# Patient Record
Sex: Male | Born: 1935 | Race: White | Hispanic: No | Smoking: Former smoker
Health system: Southern US, Academic
[De-identification: ages and names within clinical notes are randomized; demographics above are authoritative.]

## PROBLEM LIST (undated history)

## (undated) DIAGNOSIS — N1330 Unspecified hydronephrosis: Secondary | ICD-10-CM

## (undated) DIAGNOSIS — H409 Unspecified glaucoma: Secondary | ICD-10-CM

## (undated) DIAGNOSIS — H9190 Unspecified hearing loss, unspecified ear: Secondary | ICD-10-CM

## (undated) DIAGNOSIS — R310 Gross hematuria: Secondary | ICD-10-CM

## (undated) DIAGNOSIS — E782 Mixed hyperlipidemia: Secondary | ICD-10-CM

## (undated) DIAGNOSIS — I493 Ventricular premature depolarization: Secondary | ICD-10-CM

## (undated) DIAGNOSIS — Z8616 Personal history of COVID-19: Secondary | ICD-10-CM

## (undated) DIAGNOSIS — R29898 Other symptoms and signs involving the musculoskeletal system: Secondary | ICD-10-CM

## (undated) DIAGNOSIS — I10 Essential (primary) hypertension: Secondary | ICD-10-CM

## (undated) DIAGNOSIS — I951 Orthostatic hypotension: Secondary | ICD-10-CM

## (undated) DIAGNOSIS — E538 Deficiency of other specified B group vitamins: Secondary | ICD-10-CM

## (undated) DIAGNOSIS — E559 Vitamin D deficiency, unspecified: Secondary | ICD-10-CM

## (undated) DIAGNOSIS — C801 Malignant (primary) neoplasm, unspecified: Secondary | ICD-10-CM

## (undated) DIAGNOSIS — R351 Nocturia: Secondary | ICD-10-CM

## (undated) DIAGNOSIS — Z8719 Personal history of other diseases of the digestive system: Secondary | ICD-10-CM

## (undated) DIAGNOSIS — M4802 Spinal stenosis, cervical region: Secondary | ICD-10-CM

## (undated) DIAGNOSIS — G20A1 Parkinson's disease without dyskinesia, without mention of fluctuations: Secondary | ICD-10-CM

## (undated) DIAGNOSIS — G20 Parkinson's disease: Secondary | ICD-10-CM

## (undated) DIAGNOSIS — H60543 Acute eczematoid otitis externa, bilateral: Secondary | ICD-10-CM

## (undated) DIAGNOSIS — F319 Bipolar disorder, unspecified: Secondary | ICD-10-CM

## (undated) DIAGNOSIS — M439 Deforming dorsopathy, unspecified: Secondary | ICD-10-CM

## (undated) DIAGNOSIS — M75102 Unspecified rotator cuff tear or rupture of left shoulder, not specified as traumatic: Secondary | ICD-10-CM

## (undated) DIAGNOSIS — H6123 Impacted cerumen, bilateral: Secondary | ICD-10-CM

## (undated) DIAGNOSIS — G4730 Sleep apnea, unspecified: Secondary | ICD-10-CM

## (undated) DIAGNOSIS — N401 Enlarged prostate with lower urinary tract symptoms: Secondary | ICD-10-CM

## (undated) HISTORY — PX: LAPAROSCOPIC CHOLECYSTECTOMY: SUR755

## (undated) HISTORY — DX: Acute eczematoid otitis externa, bilateral: H60.543

## (undated) HISTORY — DX: Malignant (primary) neoplasm, unspecified: C80.1

## (undated) HISTORY — DX: Deficiency of other specified B group vitamins: E53.8

## (undated) HISTORY — DX: Other symptoms and signs involving the musculoskeletal system: R29.898

## (undated) HISTORY — PX: HX TONSILLECTOMY: SHX27

## (undated) HISTORY — PX: HX GALL BLADDER SURGERY/CHOLE: SHX55

## (undated) HISTORY — DX: Gross hematuria: R31.0

## (undated) HISTORY — DX: Unspecified glaucoma: H40.9

## (undated) HISTORY — DX: Parkinson's disease without dyskinesia, without mention of fluctuations: G20.A1

## (undated) HISTORY — PX: HX CATARACT REMOVAL: SHX102

## (undated) HISTORY — DX: Sleep apnea, unspecified: G47.30

## (undated) HISTORY — DX: Spinal stenosis, cervical region: M48.02

## (undated) HISTORY — PX: HX HIP REPLACEMENT: SHX124

## (undated) HISTORY — DX: Personal history of other diseases of the digestive system: Z87.19

## (undated) HISTORY — DX: Parkinson's disease (CMS HCC): G20

## (undated) HISTORY — DX: Unspecified hearing loss, unspecified ear: H91.90

## (undated) HISTORY — DX: Vitamin D deficiency, unspecified: E55.9

## (undated) HISTORY — DX: Nocturia: R35.1

## (undated) HISTORY — DX: Deforming dorsopathy, unspecified: M43.9

## (undated) HISTORY — DX: Personal history of COVID-19: Z86.16

## (undated) HISTORY — DX: Benign prostatic hyperplasia with lower urinary tract symptoms: N40.1

## (undated) HISTORY — DX: Unspecified hydronephrosis: N13.30

## (undated) HISTORY — DX: Bipolar disorder, unspecified: F31.9

## (undated) HISTORY — DX: Ventricular premature depolarization: I49.3

## (undated) HISTORY — DX: Unspecified rotator cuff tear or rupture of left shoulder, not specified as traumatic: M75.102

## (undated) HISTORY — PX: HX BACK SURGERY: SHX140

## (undated) HISTORY — DX: Essential (primary) hypertension: I10

## (undated) HISTORY — DX: Impacted cerumen, bilateral: H61.23

## (undated) HISTORY — DX: Orthostatic hypotension: I95.1

## (undated) HISTORY — DX: Mixed hyperlipidemia: E78.2

---

## 1999-05-17 ENCOUNTER — Inpatient Hospital Stay (HOSPITAL_COMMUNITY): Payer: Self-pay

## 2011-05-07 DIAGNOSIS — M1610 Unilateral primary osteoarthritis, unspecified hip: Secondary | ICD-10-CM | POA: Insufficient documentation

## 2011-05-07 DIAGNOSIS — Z96642 Presence of left artificial hip joint: Secondary | ICD-10-CM | POA: Insufficient documentation

## 2013-10-27 DIAGNOSIS — H409 Unspecified glaucoma: Secondary | ICD-10-CM | POA: Insufficient documentation

## 2013-10-27 DIAGNOSIS — M419 Scoliosis, unspecified: Secondary | ICD-10-CM | POA: Insufficient documentation

## 2013-10-30 DIAGNOSIS — R000 Tachycardia, unspecified: Secondary | ICD-10-CM | POA: Insufficient documentation

## 2013-10-30 DIAGNOSIS — D62 Acute posthemorrhagic anemia: Secondary | ICD-10-CM | POA: Insufficient documentation

## 2013-11-29 DIAGNOSIS — Z981 Arthrodesis status: Secondary | ICD-10-CM | POA: Insufficient documentation

## 2014-03-28 DIAGNOSIS — Z9989 Dependence on other enabling machines and devices: Secondary | ICD-10-CM | POA: Insufficient documentation

## 2014-04-03 DIAGNOSIS — M40203 Unspecified kyphosis, cervicothoracic region: Secondary | ICD-10-CM | POA: Insufficient documentation

## 2018-11-22 DIAGNOSIS — F331 Major depressive disorder, recurrent, moderate: Secondary | ICD-10-CM | POA: Insufficient documentation

## 2020-08-17 IMAGING — MR MRI BRAIN WITHOUT CONTRAST
8 of 9 series · 36 of 48 positions shown · non-contrast
Comparison: None previous.

﻿EXAM:  MRI BRAIN WITHOUT CONTRAST
INDICATION: 85-year-old with left-sided weakness.  No prior history of malignancy.  History of back surgery.
TECHNIQUE: Axial, coronal and sagittal images including diffusion-weighted sequence, T2* gradient echo sequence, FLAIR sequence and T1 and T2 sequences.

[Series 5: DWI · axial · 5.0mm · 1.35mm/px · z∈[-23,+97]mm · 10 of 88 slices shown (1 of 3)]
[im 6/88]
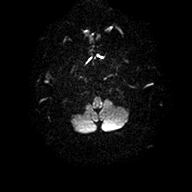
[im 12/88]
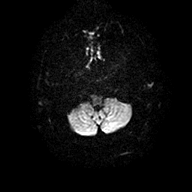
[im 18/88]
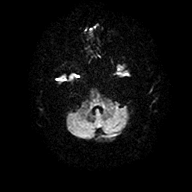
[im 30/88]
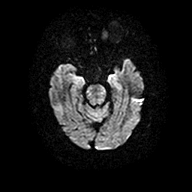
[im 41/88]
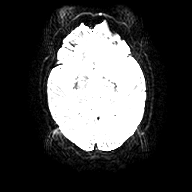
[im 47/88]
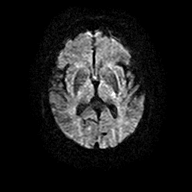
[im 53/88]
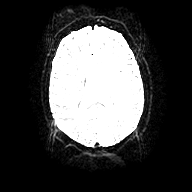
[im 64/88]
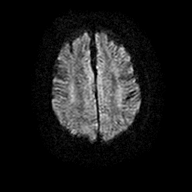
[im 76/88]
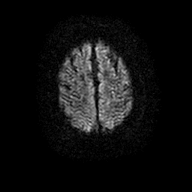
[im 88/88]
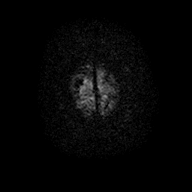

[Series 6: DWI · axial · 5.0mm · 1.35mm/px · z∈[-29,+97]mm · 4 of 22 slices shown (2 of 3)]
[im 1/22]
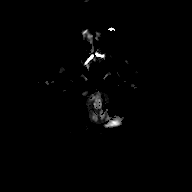
[im 8/22]
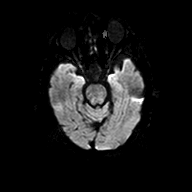
[im 15/22]
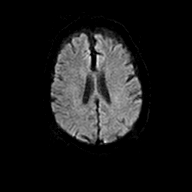
[im 22/22]
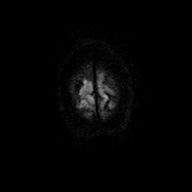

[Series 7: DWI · axial · 5.0mm · 1.35mm/px · z∈[-29,+97]mm · 4 of 22 slices shown (3 of 3)]
[im 1/22]
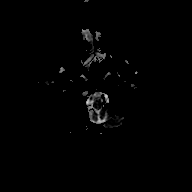
[im 8/22]
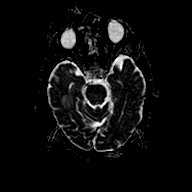
[im 15/22]
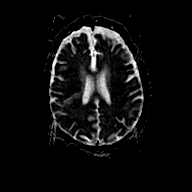
[im 22/22]
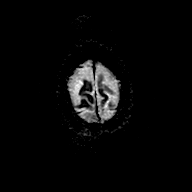

[Series 8: FLAIR · sagittal · 4.0mm · 0.75mm/px · 4 of 26 slices shown (1 of 2)]
[im 1/26]
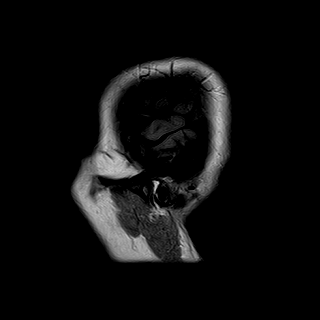
[im 9/26]
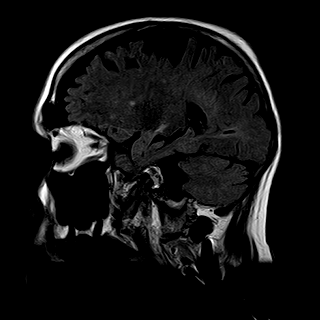
[im 17/26]
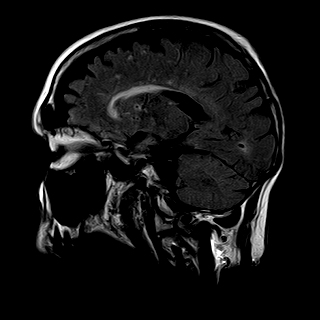
[im 26/26]
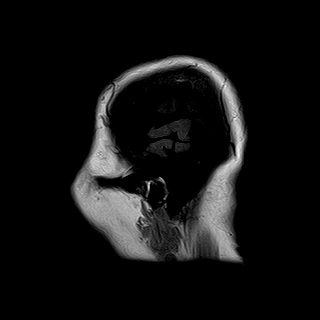

[Series 9: T2 · axial · 5.0mm · 0.43mm/px · z∈[-39,+105]mm · 4 of 25 slices shown (1 of 2)]
[im 1/25]
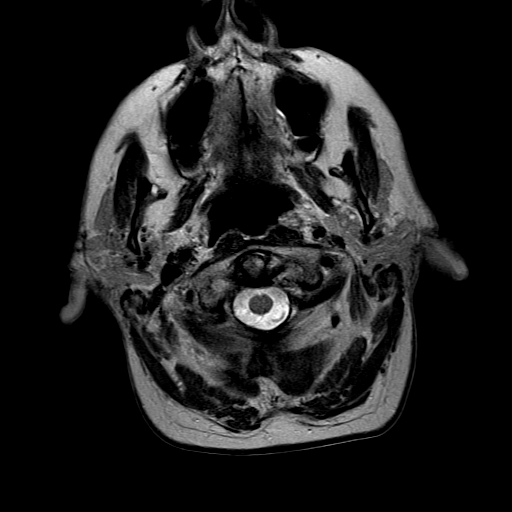
[im 9/25]
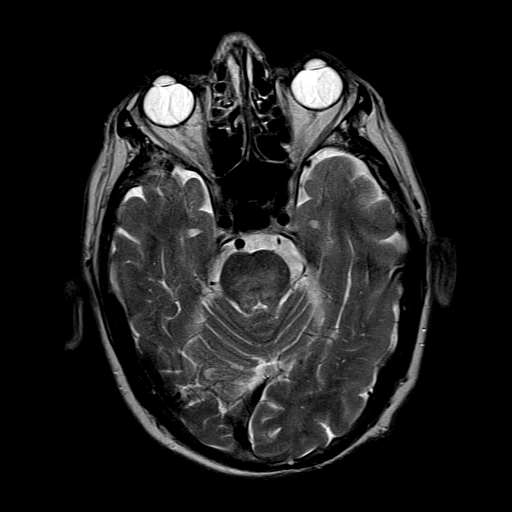
[im 17/25]
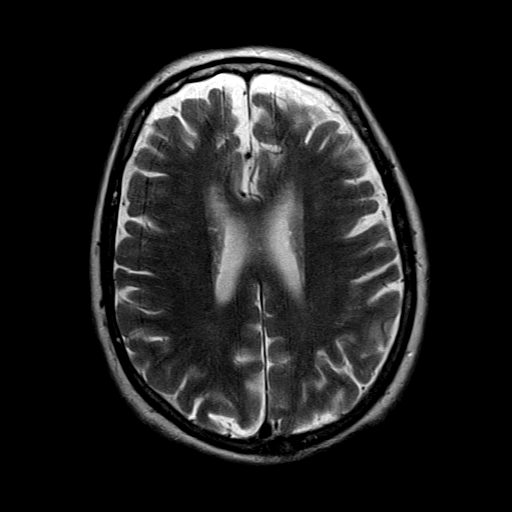
[im 25/25]
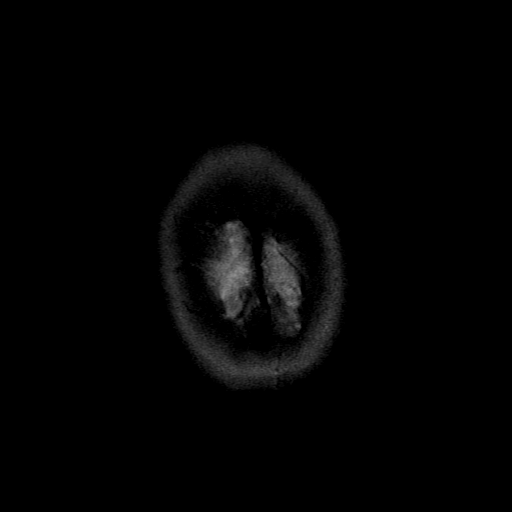

[Series 10: FLAIR · axial · 5.0mm · 0.43mm/px · z∈[-39,+105]mm · 4 of 25 slices shown (2 of 2)]
[im 1/25]
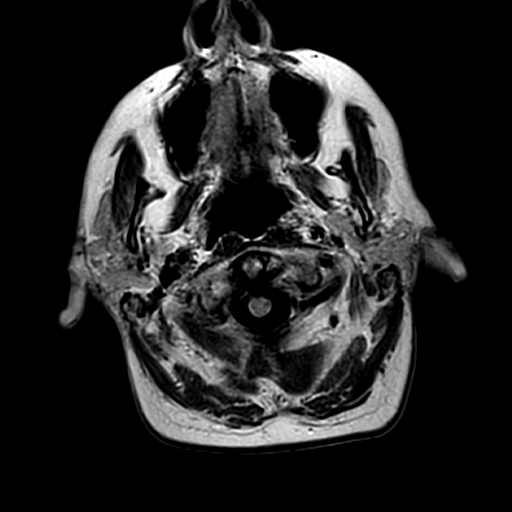
[im 9/25]
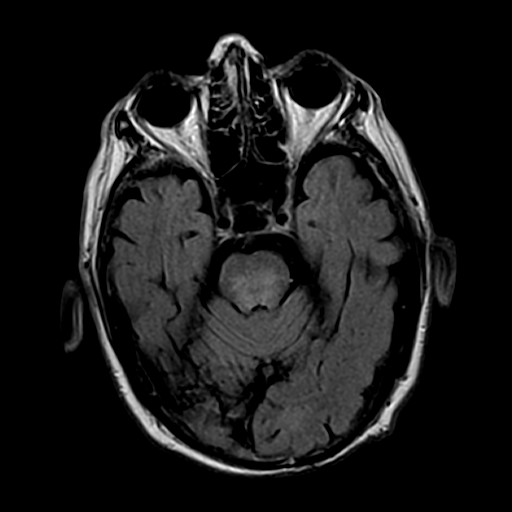
[im 17/25]
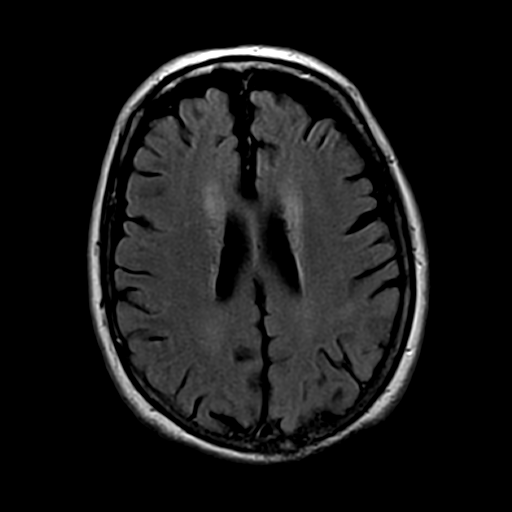
[im 25/25]
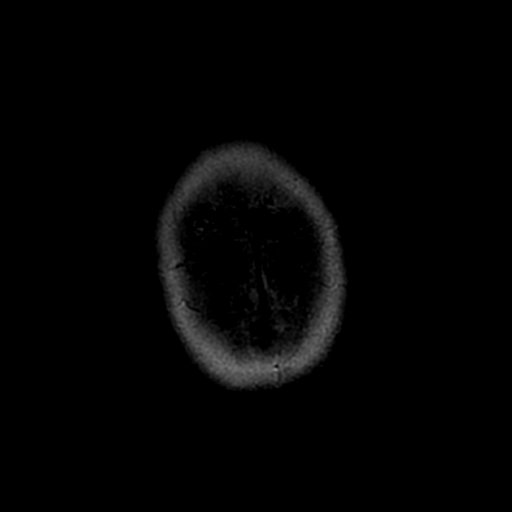

[Series 11: T1 · axial · 5.0mm · 0.43mm/px · z∈[-39,+9]mm · 2 of 25 slices shown]
[im 1/25]
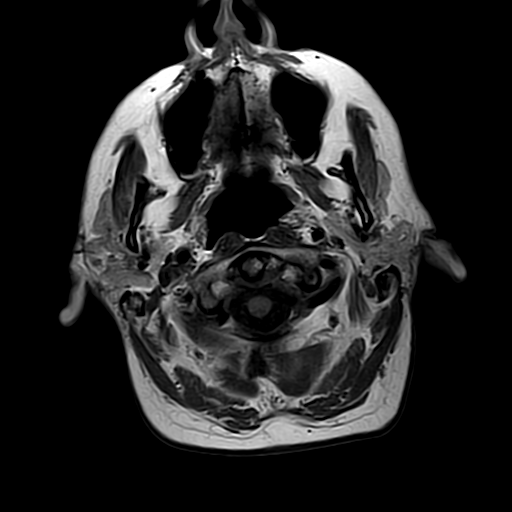
[im 9/25]
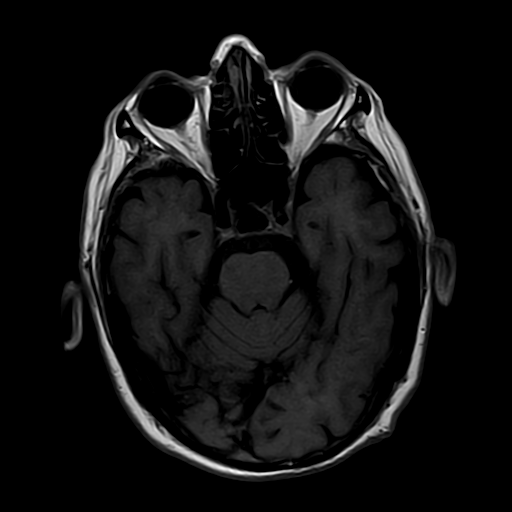

[Series 13: T2 · coronal · 6.0mm · 0.43mm/px · 4 of 24 slices shown (2 of 2)]
[im 1/24]
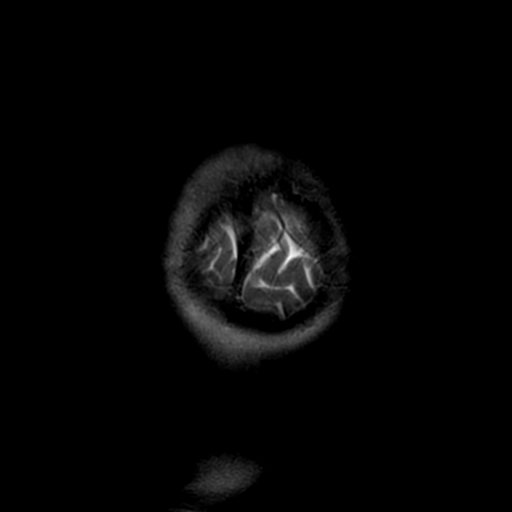
[im 8/24]
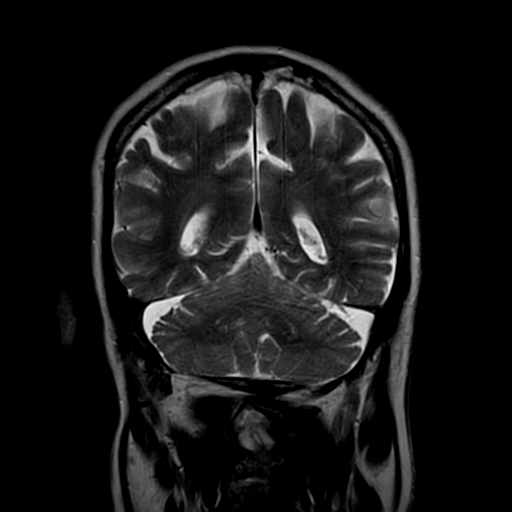
[im 16/24]
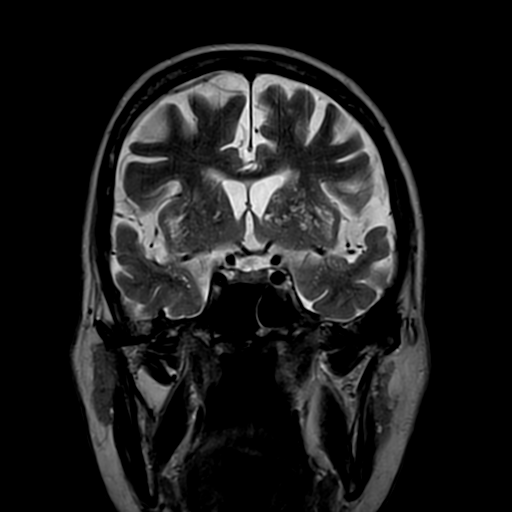
[im 24/24]
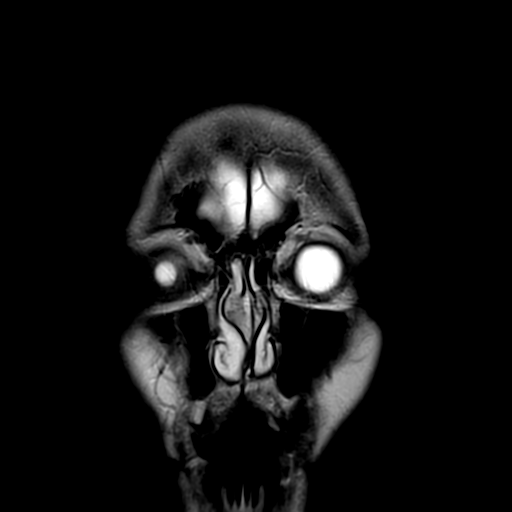

[36 of 48 positions shown; findings below may reference images not displayed]

FINDINGS: No acute or subacute ischemic changes on diffusion sequence. 

Mild chronic small vessel ischemic change of deep white matter.  Moderately significant symmetric global cerebral cortical atrophy.  Moderate symmetric cerebellar atrophy.  No ventriculomegaly or midline shift.  

Major arteries of circle of Willis and dural venous sinuses are patent.
IMPRESSION: 1. No evidence of acute or subacute ischemia or demyelinating process. 

2. No ventriculomegaly or space occupying lesions.

3. Moderately significant global symmetric cerebral atrophy and cerebellar atrophy.  

4. Arteries of circle of Willis are patent.

## 2020-08-28 IMAGING — MR MRI LUMBAR SPINE WITHOUT CONTRAST
6 series · 44 of 48 positions shown · IV contrast (gadolinium)
Comparison: None available.

﻿EXAM:  MRI LUMBAR SPINE WITHOUT CONTRAST
INDICATION: Chronic lower back pain.
TECHNIQUE: Multiplanar multisequential MRI of the lumbar spine was performed without gadolinium contrast.

[Series 10: T2 · sagittal · 4.2mm · 0.94mm/px · 7 of 15 slices shown (1 of 3)]
[im 1/15]
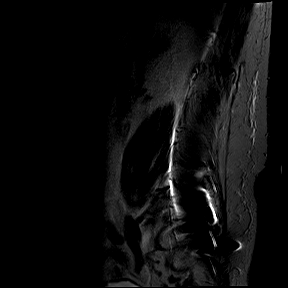
[im 3/15]
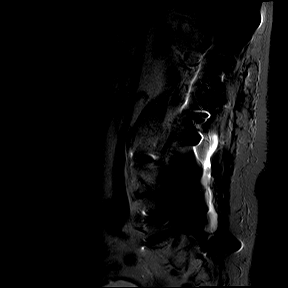
[im 5/15]
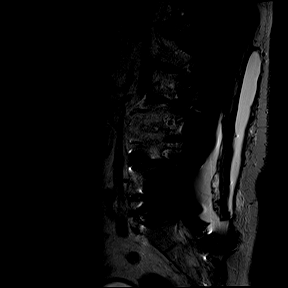
[im 8/15]
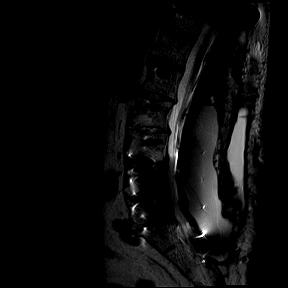
[im 10/15]
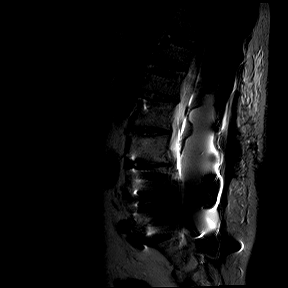
[im 12/15]
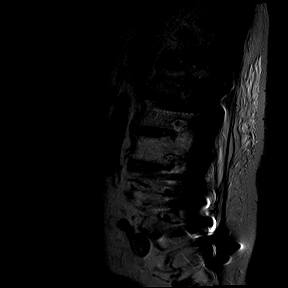
[im 15/15]
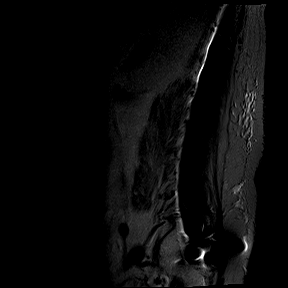

[Series 11: T1 · sagittal · 4.2mm · 0.94mm/px · 6 of 15 slices shown (1 of 2)]
[im 1/15]
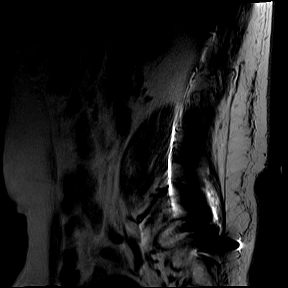
[im 3/15]
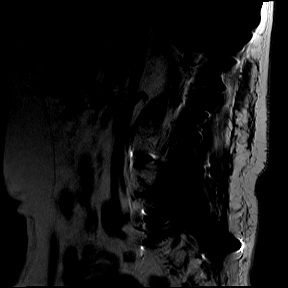
[im 6/15]
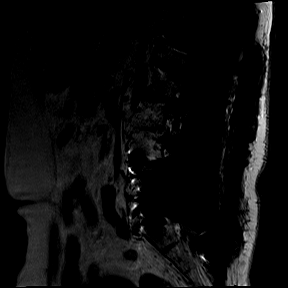
[im 9/15]
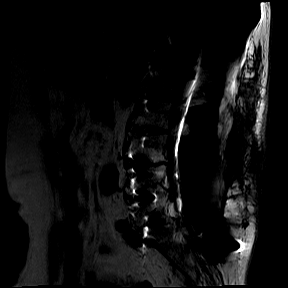
[im 12/15]
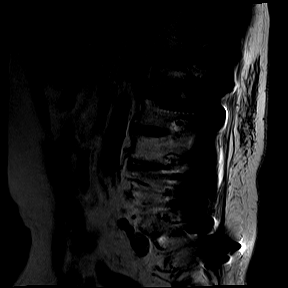
[im 15/15]
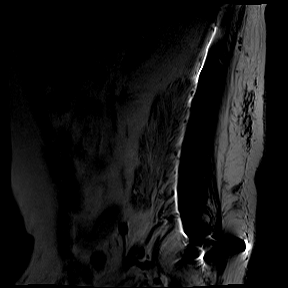

[Series 12: STIR · sagittal · 4.2mm · 1.05mm/px · 6 of 15 slices shown]
[im 1/15]
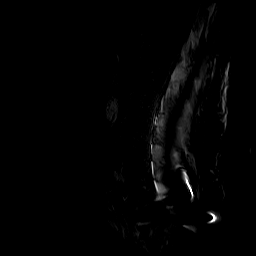
[im 3/15]
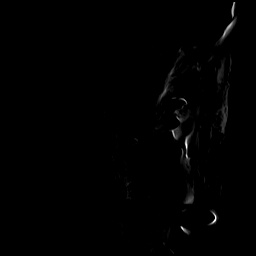
[im 6/15]
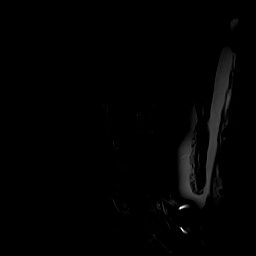
[im 9/15]
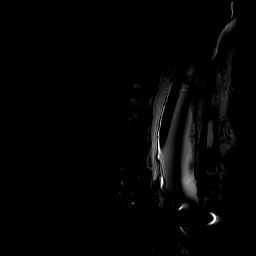
[im 12/15]
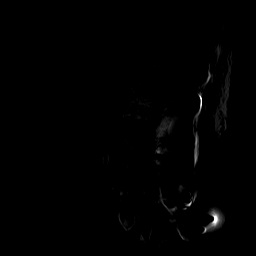
[im 15/15]
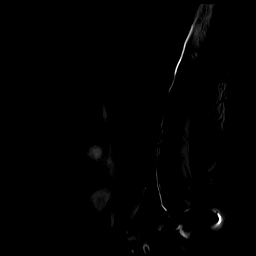

[Series 14: T2 · axial · 4.0mm · 0.47mm/px · z∈[-98,+70]mm · 8 of 23 slices shown (2 of 3)]
[im 1/23]
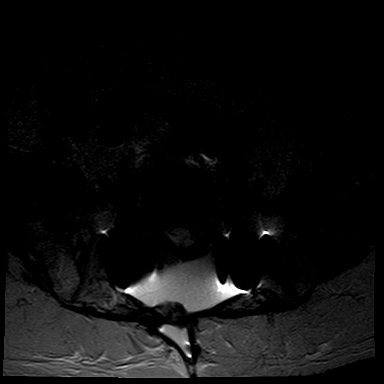
[im 3/23]
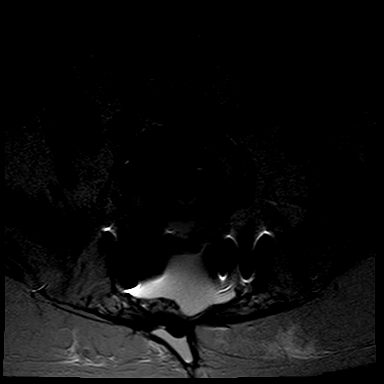
[im 8/23]
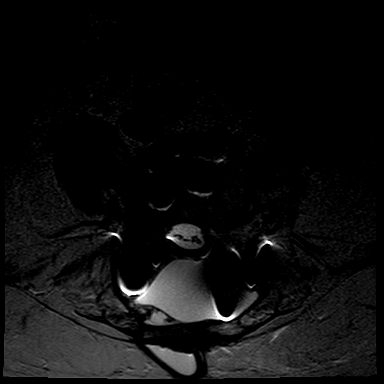
[im 10/23]
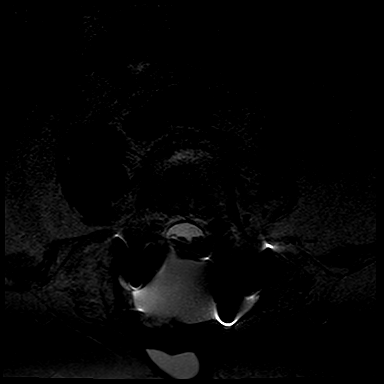
[im 13/23]
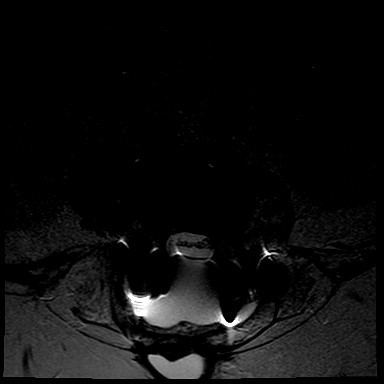
[im 15/23]
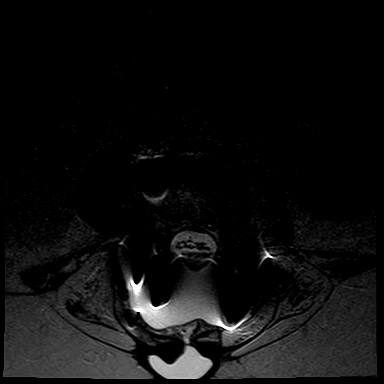
[im 20/23]
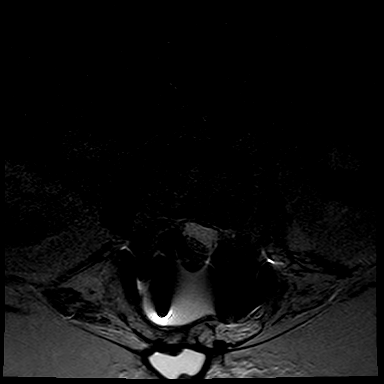
[im 23/23]
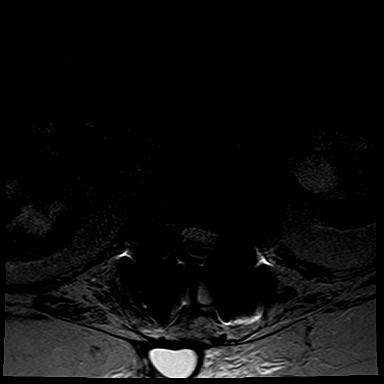

[Series 15: T1 · axial · 4.0mm · 0.47mm/px · z∈[-98,+70]mm · 8 of 23 slices shown (2 of 2)]
[im 1/23]
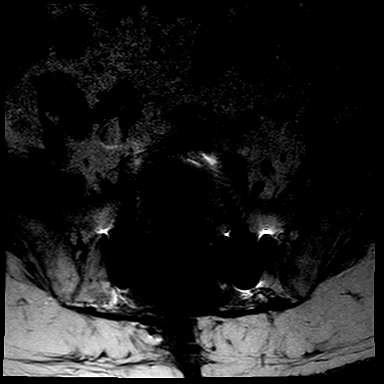
[im 3/23]
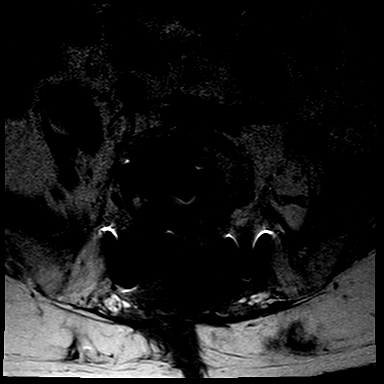
[im 8/23]
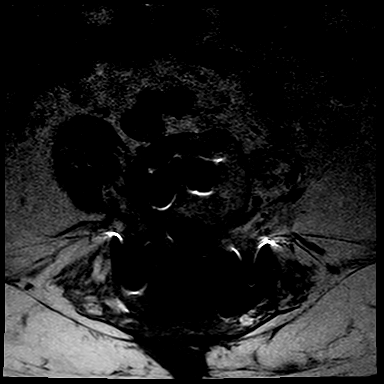
[im 10/23]
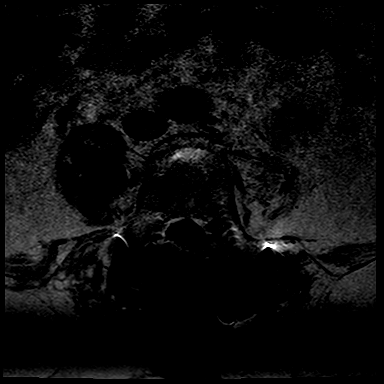
[im 13/23]
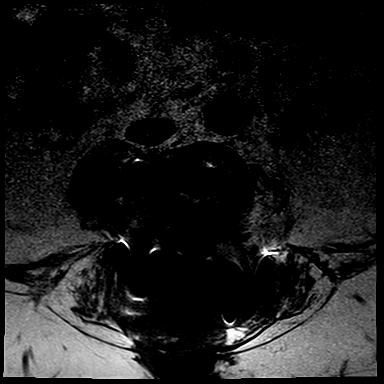
[im 15/23]
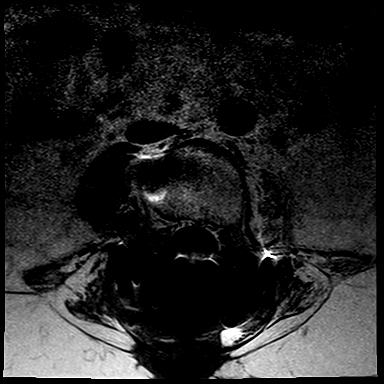
[im 20/23]
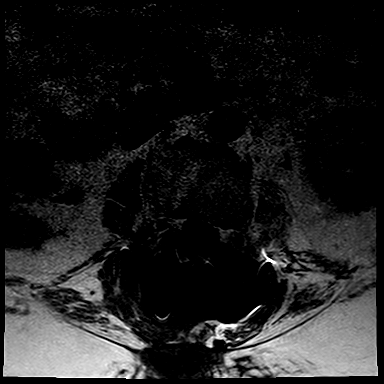
[im 23/23]
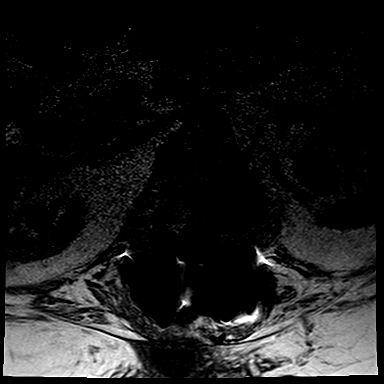

[Series 16: T2 · coronal · 4.0mm · 0.90mm/px · 9 of 20 slices shown (3 of 3)]
[im 1/20]
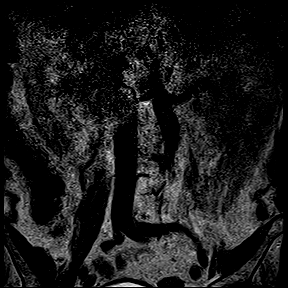
[im 3/20]
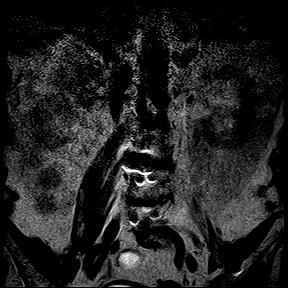
[im 5/20]
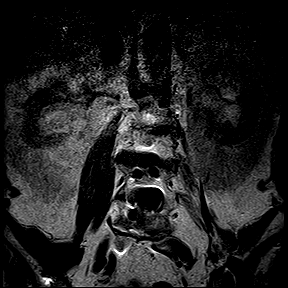
[im 8/20]
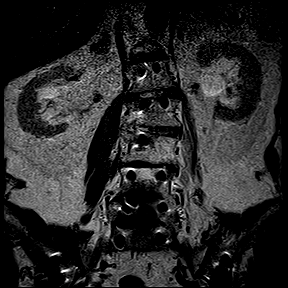
[im 10/20]
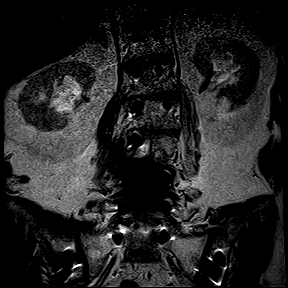
[im 12/20]
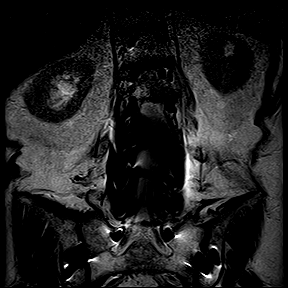
[im 15/20]
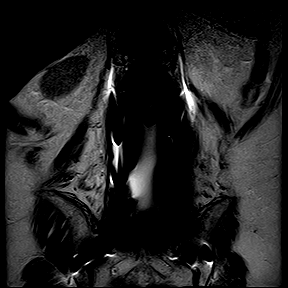
[im 17/20]
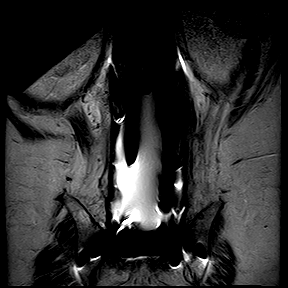
[im 20/20]
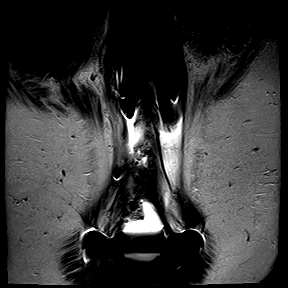

[44 of 48 positions shown; findings below may reference images not displayed]

FINDINGS: Bone marrow signal intensity is normal. There is no acute fracture or subluxation. Extensive posterior surgical fusion of the visualized thoracolumbar spine is noted with bipedicular screws and rods, extending up to S1 vertebral body level. Interbody fusion devices are also noted at L3-4, L4-5 and L5-S1 levels. L2 through L5 laminectomy defects are also identified. Fluid within the laminectomy beds is noted at all levels, likely postsurgical. Distal spinal cord is normal in signal intensity and terminates normally at T12-L1 disc space level. There is clumping of nerve roots within the dependent thecal sac suggestive of arachnoiditis.  

At L1-2 level, there is a small broad-based central disc bulge, mildly effacing the ventral thecal sac. There is no significant neural foraminal stenosis.

At L2-3 level, there is a minimal bulging annulus, minimally effacing the ventral thecal sac. There is no significant neural foraminal stenosis.

L3-4, L4-5 and L5-S1 levels appear unremarkable.

Parapelvic renal cysts are noted bilaterally.
IMPRESSION: 1. Extensive postsurgical changes of the visualized thoracolumbar spine. 

2. Clumping of nerve roots within the dependent thecal sac suggestive of arachnoiditis. 

3. No significant disc herniation, spinal canal or neural foraminal stenosis at any level.

## 2020-08-28 IMAGING — MR MRI CERVICAL SPINE WITHOUT CONTRAST
5 of 6 series · 33 of 48 positions shown · IV contrast (gadolinium)
Comparison: None available.

﻿EXAM:  MRI CERVICAL SPINE WITHOUT CONTRAST
INDICATION: Neck pain.
TECHNIQUE: Multiplanar multisequential MRI of the cervical spine was performed without gadolinium contrast.

[Series 5: T2 · sagittal · 3.0mm · 0.78mm/px · 6 of 13 slices shown (1 of 3)]
[im 1/13]
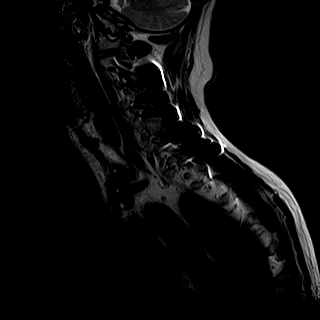
[im 3/13]
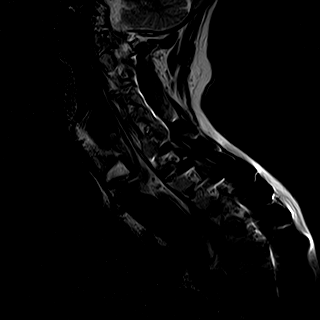
[im 5/13]
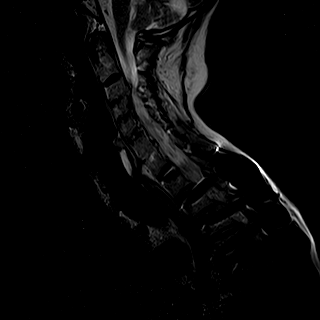
[im 8/13]
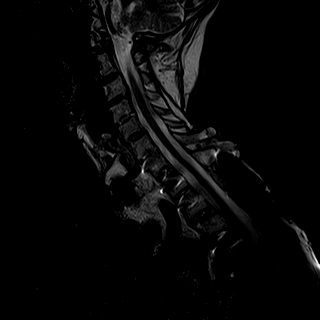
[im 10/13]
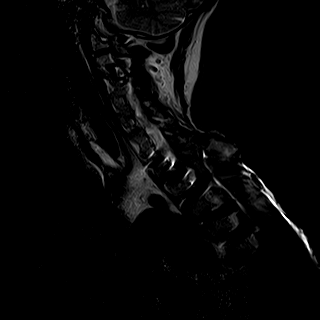
[im 13/13]
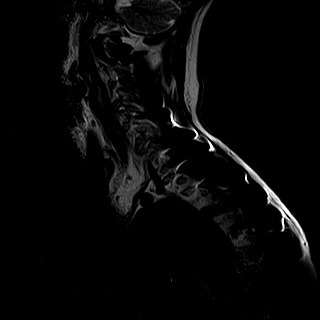

[Series 6: T1 · sagittal · 3.0mm · 0.78mm/px · 6 of 13 slices shown]
[im 1/13]
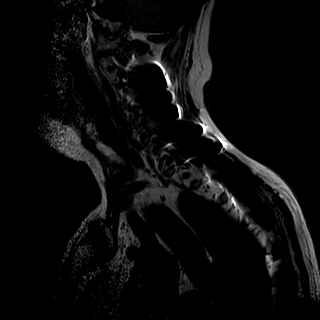
[im 3/13]
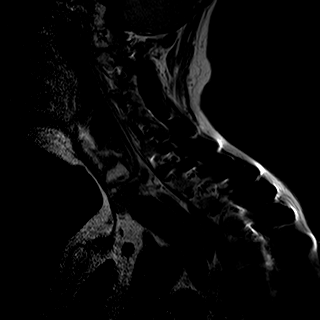
[im 5/13]
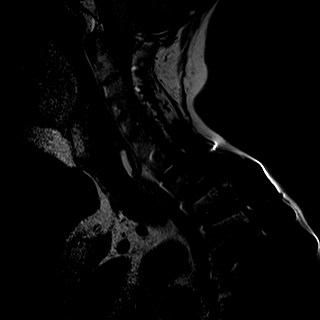
[im 8/13]
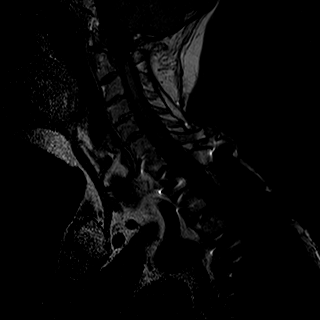
[im 10/13]
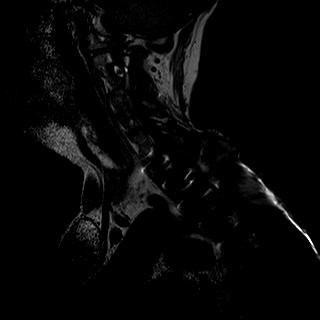
[im 13/13]
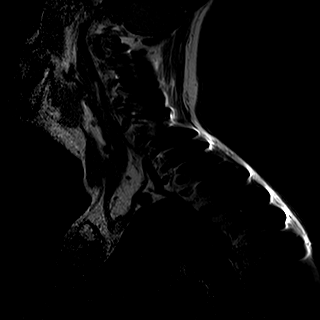

[Series 7: STIR · sagittal · 3.0mm · 0.87mm/px · 3 of 13 slices shown]
[im 1/13]
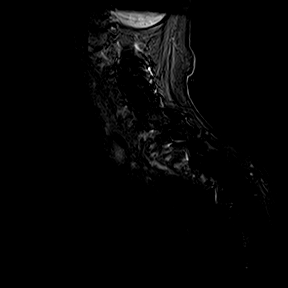
[im 3/13]
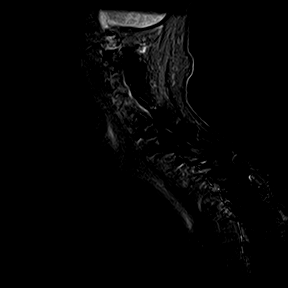
[im 5/13]
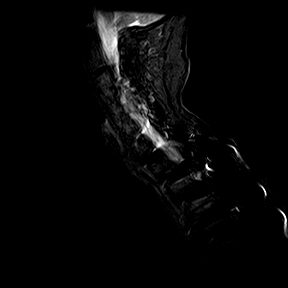

[Series 8: T2 · axial · 4.0mm · 0.56mm/px · z∈[-69,+17]mm · 9 of 26 slices shown (2 of 3)]
[im 1/26]
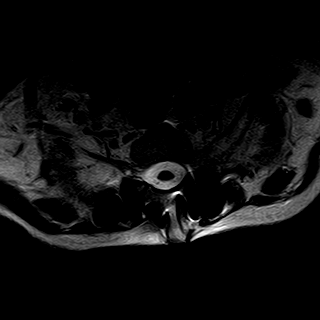
[im 5/26]
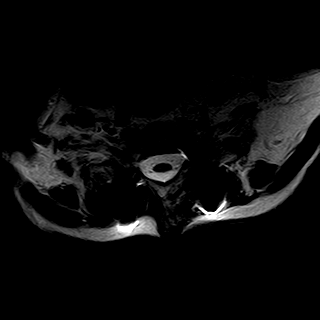
[im 7/26]
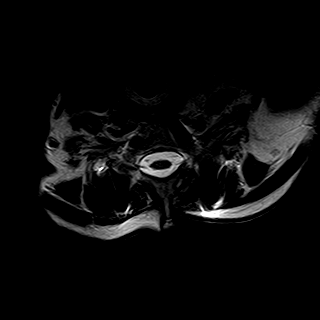
[im 12/26]
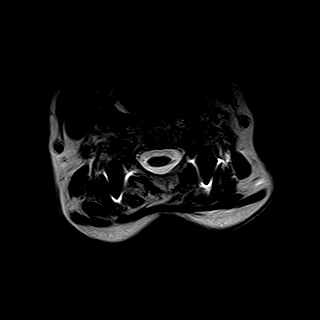
[im 14/26]
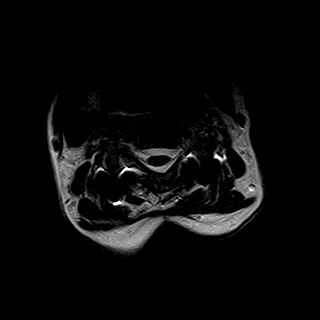
[im 19/26]
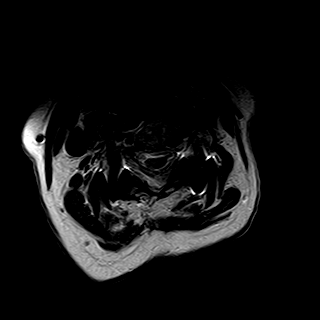
[im 21/26]
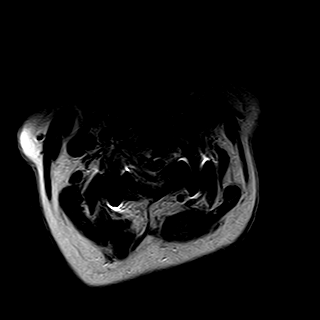
[im 23/26]
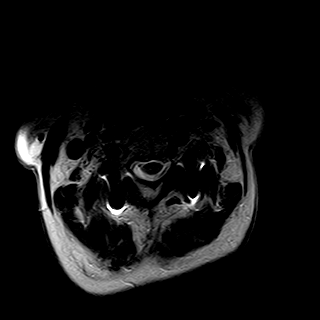
[im 26/26]
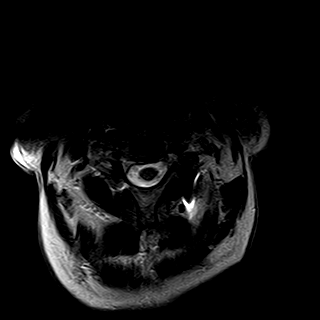

[Series 11: T2 · oblique · 4.0mm · 0.31mm/px · 9 of 18 slices shown (3 of 3)]
[im 1/18]
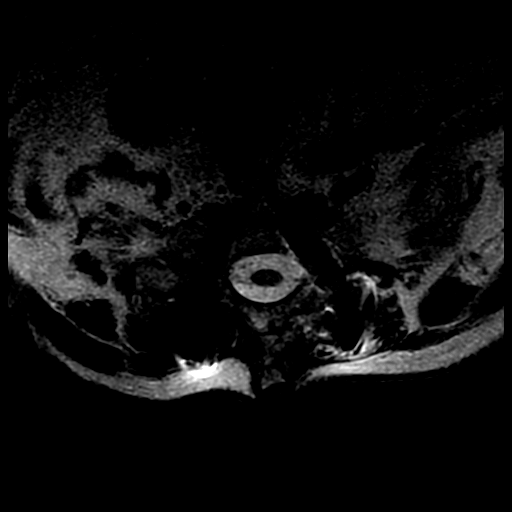
[im 3/18]
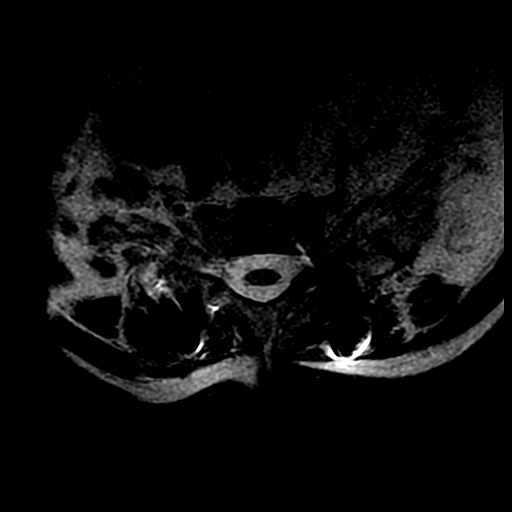
[im 5/18]
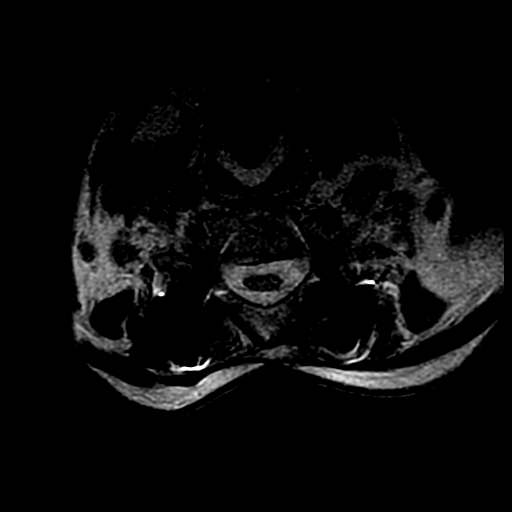
[im 7/18]
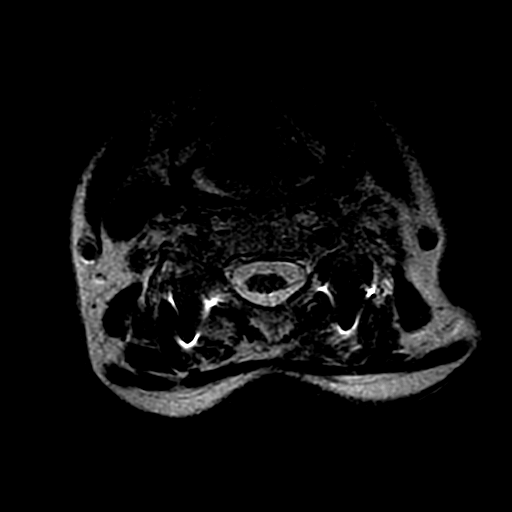
[im 9/18]
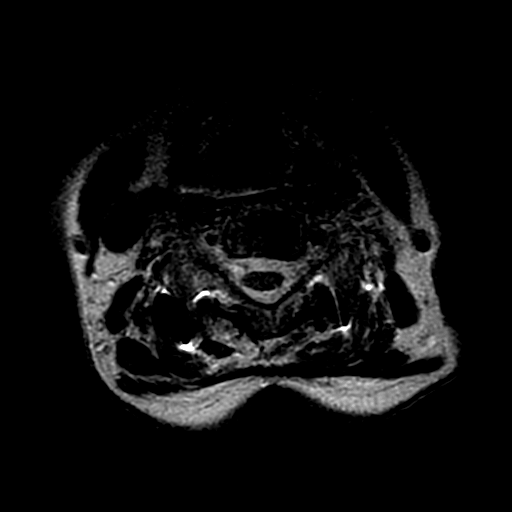
[im 11/18]
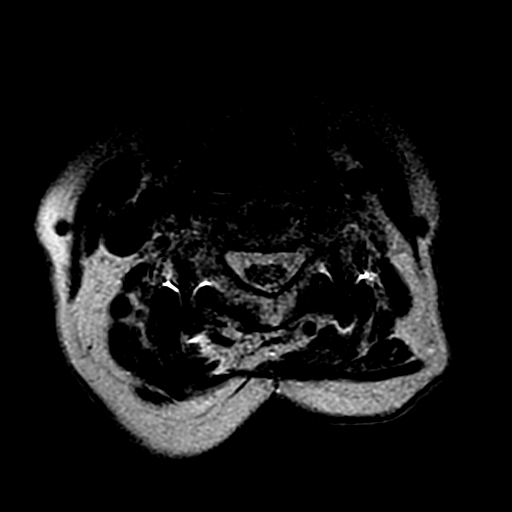
[im 13/18]
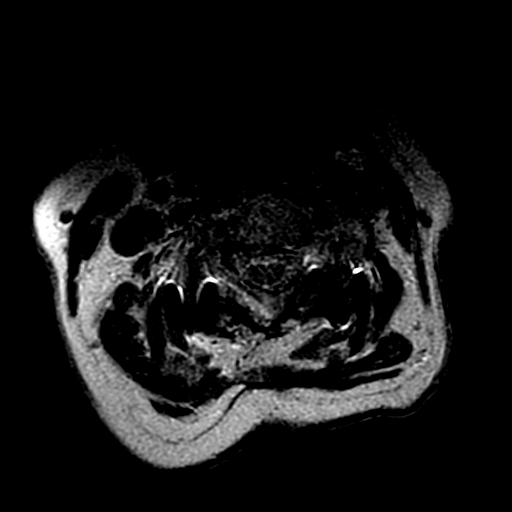
[im 15/18]
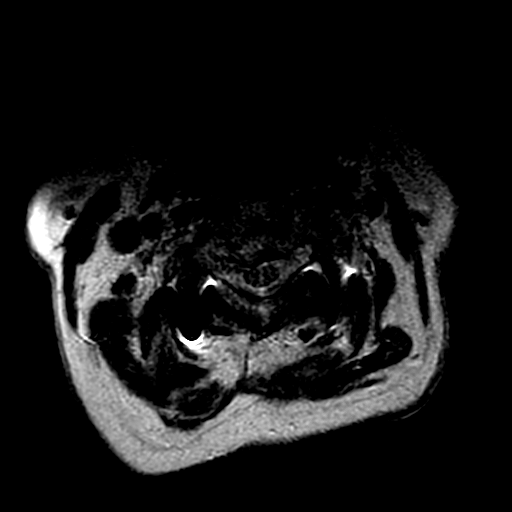
[im 18/18]
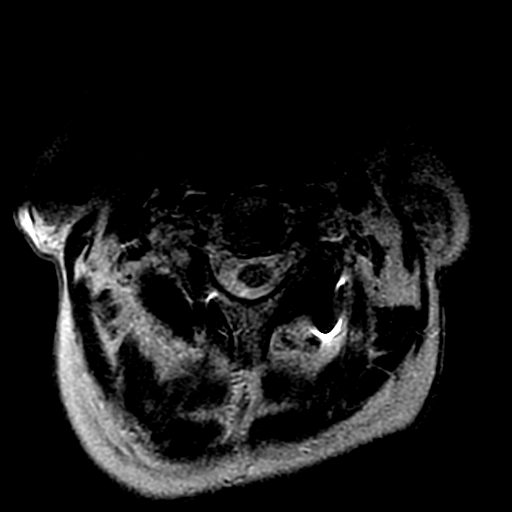

[33 of 48 positions shown; findings below may reference images not displayed]

FINDINGS: Bone marrow signal intensity is normal. There is no acute fracture or subluxation. Extensive surgical fusion of the visualized thoracic spine is seen starting from C7 vertebral body. Visualized cervical spinal cord is normal in signal intensity without evidence of compression at any level.

C2-3 level is unremarkable.

At C3-4 level, there is moderate disc desiccation. There is a small broad-based central disc bulge partially effacing the ventral CSF. There is no significant neural foraminal stenosis.

C4-5 level is unremarkable.

At C5-6 level, there is minimal anterolisthesis of C5 on C6 vertebral body. There is a minimal bulging annulus, minimally effacing the ventral CSF. There is no significant neural foraminal stenosis.

At C6-7 level, there is a small broad-based central disc bulge, mildly effacing the ventral CSF. There is no significant neural foraminal stenosis.

C7-T1 level and paraspinal soft tissues are unremarkable.
IMPRESSION: 1. Incompletely assessed extensive postsurgical changes of the thoracic spine from C7 vertebral body level. 

2. Minimal anterolisthesis of C5 on C6 vertebral body. 

3. No significant disc herniation, spinal canal or neural foraminal stenosis at any level within the cervical spine.

## 2020-09-13 IMAGING — MR MRI JOINT UPPER EXTREMITY WITHOUT CONTRAST LT
6 series · 40 of 40 positions shown · IV contrast (gadolinium)
Comparison: None available.

﻿EXAM:  MRI JOINT UPPER EXTREMITY WITHOUT CONTRAST LT
INDICATION: Pain, history of fall.
TECHNIQUE: Multiplanar multisequential MRI of the left shoulder joint was performed without gadolinium contrast.

[Series 8: T1 · oblique · left · 3.5mm · 0.33mm/px · 7 of 21 slices shown]
[im 1/21]
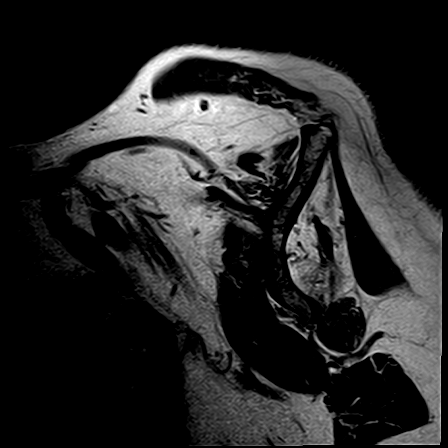
[im 4/21]
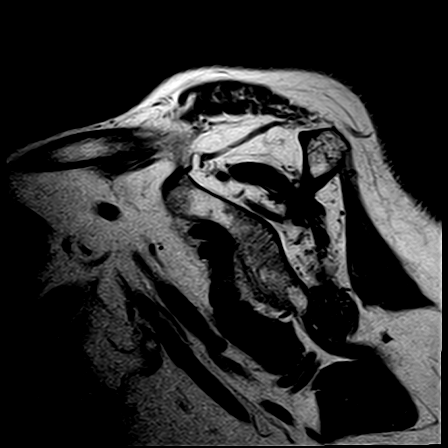
[im 7/21]
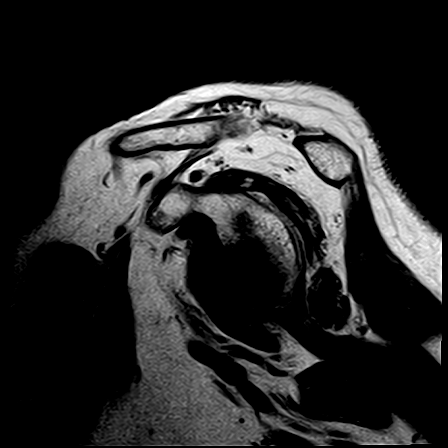
[im 11/21]
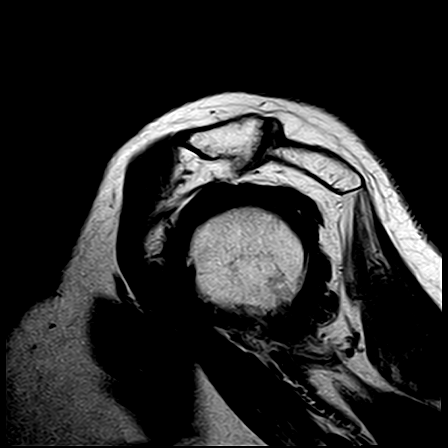
[im 14/21]
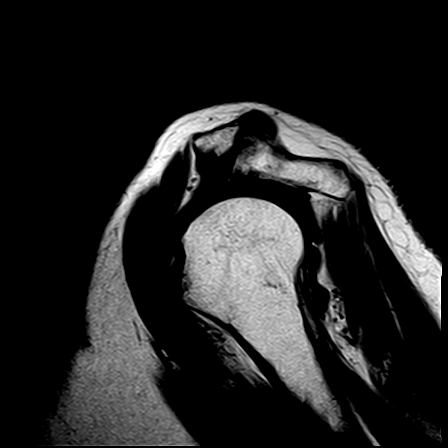
[im 17/21]
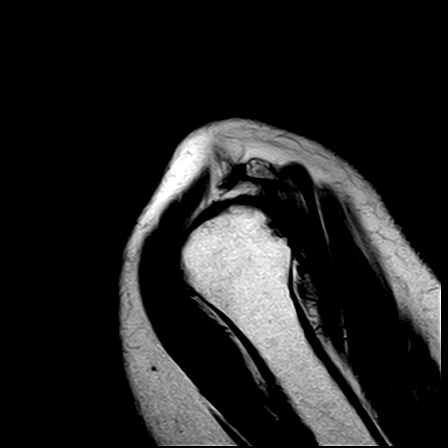
[im 21/21]
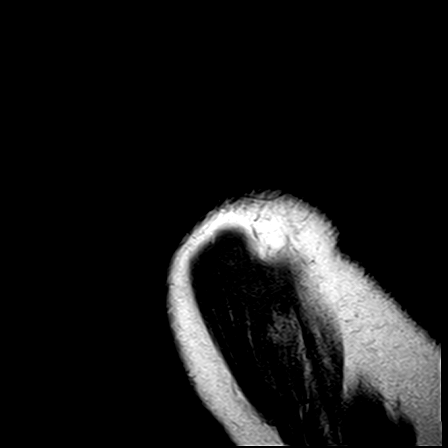

[Series 9: PD fat-sat · axial · left · 4.0mm · 0.50mm/px · z∈[-85,+2]mm · 7 of 21 slices shown (1 of 3)]
[im 1/21]
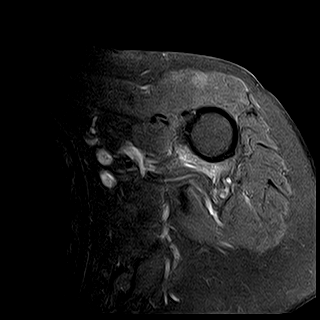
[im 4/21]
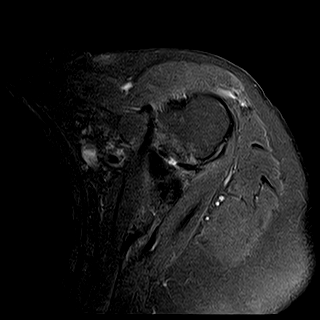
[im 7/21]
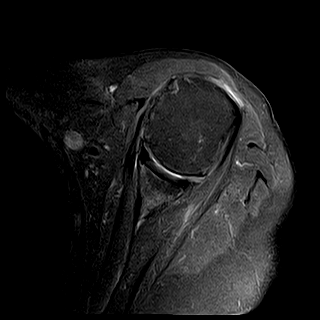
[im 11/21]
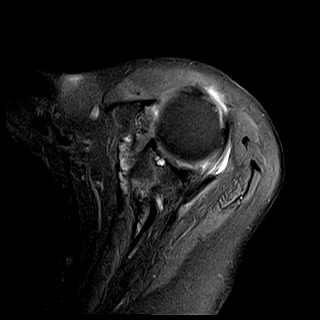
[im 14/21]
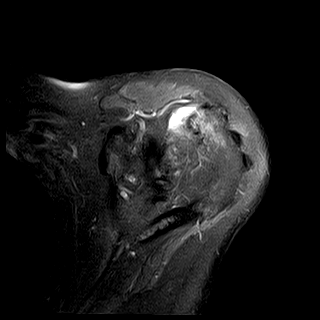
[im 17/21]
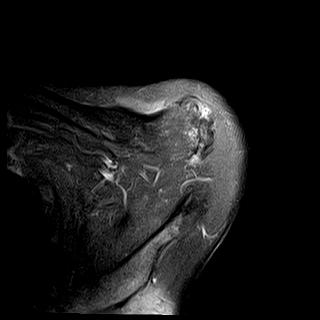
[im 21/21]
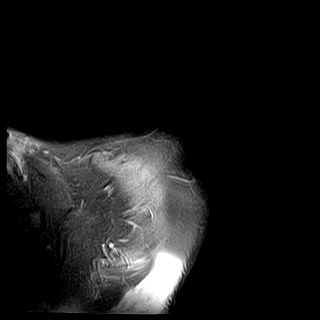

[Series 10: PD fat-sat · axial · left · 4.0mm · 0.50mm/px · z∈[-85,+2]mm · 6 of 21 slices shown (2 of 3)]
[im 1/21]
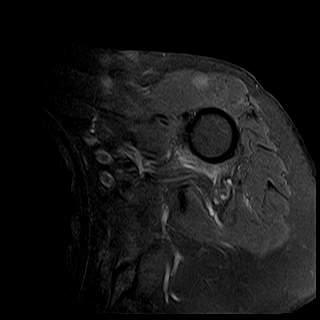
[im 5/21]
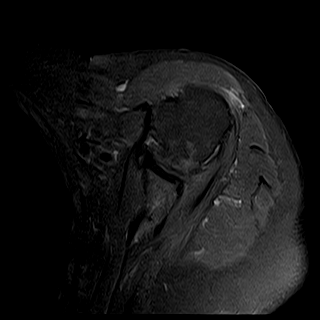
[im 9/21]
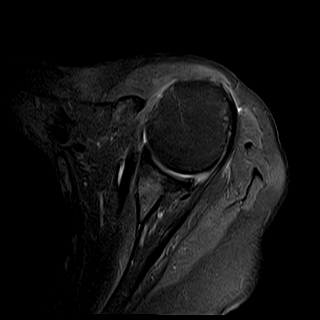
[im 13/21]
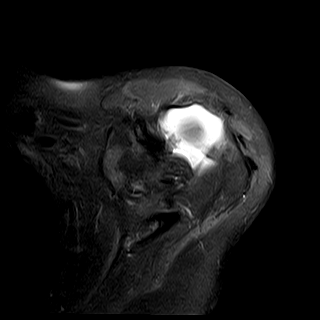
[im 17/21]
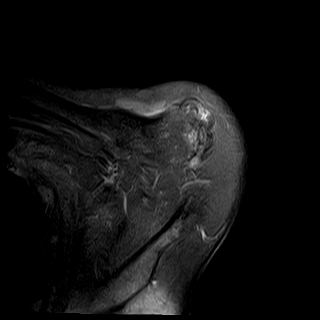
[im 21/21]
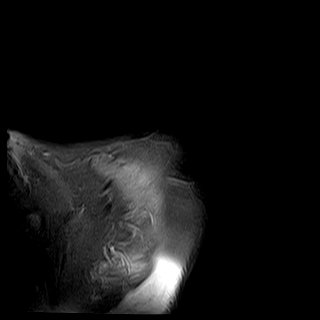

[Series 11: STIR · oblique · left · 3.5mm · 0.47mm/px · 6 of 21 slices shown (1 of 2)]
[im 1/21]
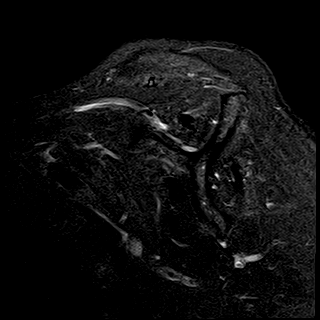
[im 5/21]
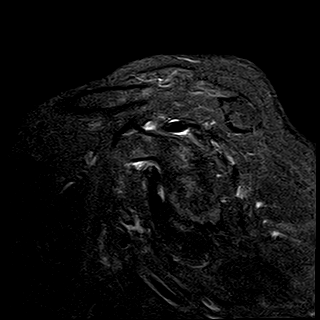
[im 9/21]
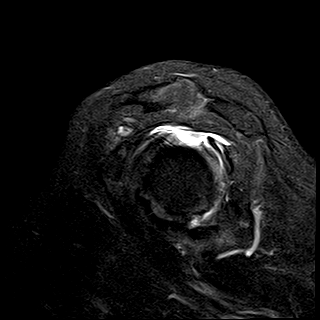
[im 13/21]
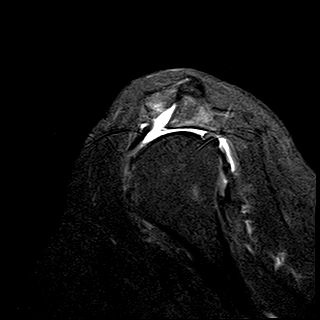
[im 17/21]
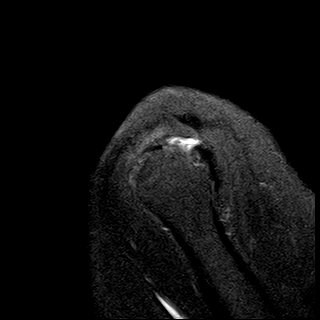
[im 21/21]
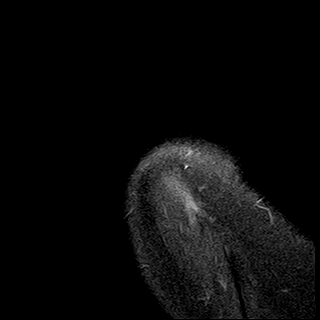

[Series 12: PD fat-sat · oblique · left · 4.0mm · 0.47mm/px · 7 of 23 slices shown (3 of 3)]
[im 1/23]
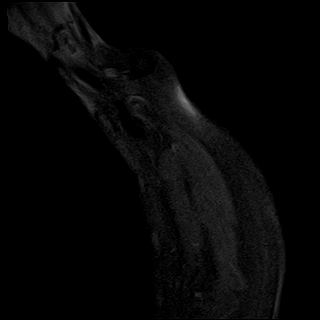
[im 4/23]
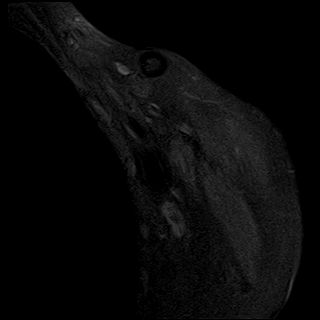
[im 8/23]
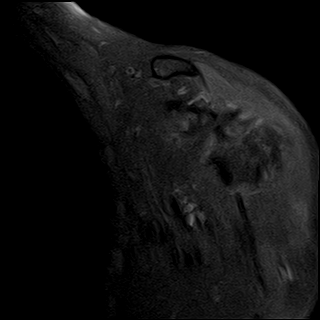
[im 12/23]
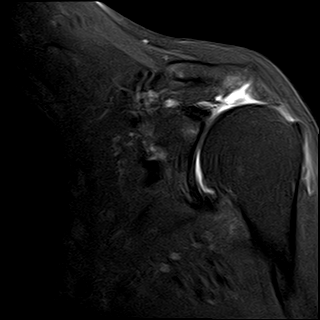
[im 15/23]
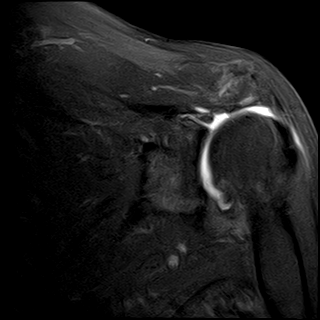
[im 19/23]
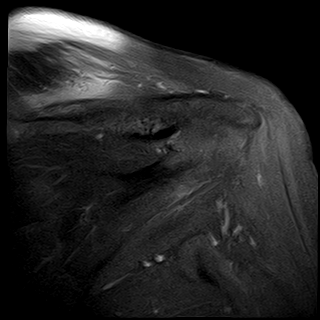
[im 23/23]
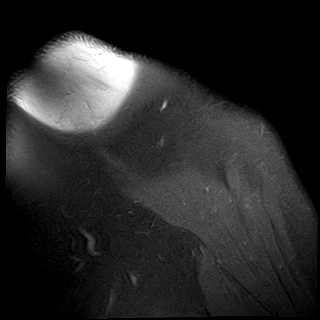

[Series 13: STIR · oblique · left · 4.0mm · 0.47mm/px · 7 of 23 slices shown (2 of 2)]
[im 1/23]
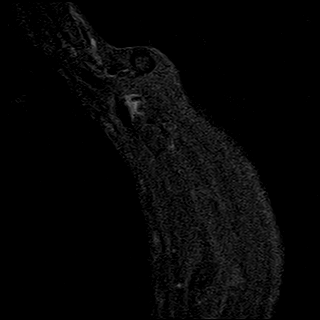
[im 4/23]
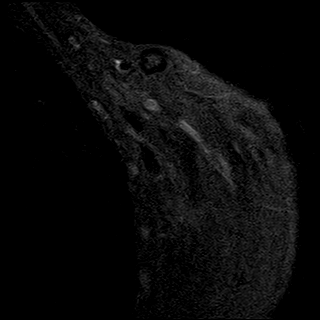
[im 8/23]
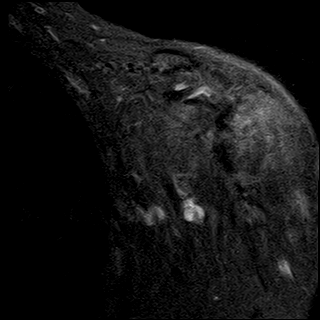
[im 12/23]
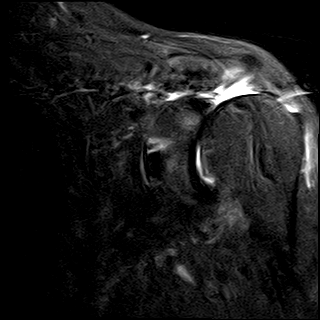
[im 15/23]
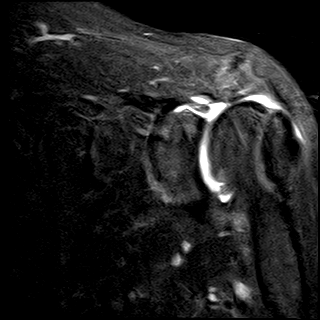
[im 19/23]
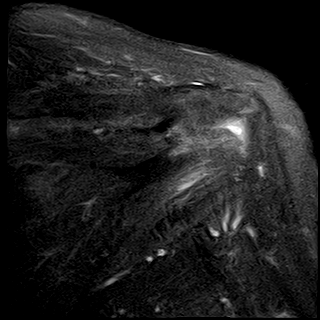
[im 23/23]
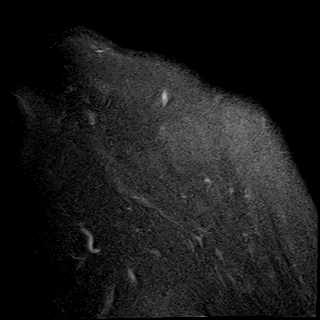

[40 of 40 positions shown; findings below may reference images not displayed]

FINDINGS: There are large chronic full-thickness supraspinatus, infraspinatus and subscapularis tendon tears. Teres minor muscle and tendon is within normal limits in morphology and signal intensity. Long head of biceps tendon is also completely torn and slightly retracted. There is a complex superior labral tear. There is near complete glenohumeral articular cartilage loss with mild subarticular cystic changes within the superior glenoid. Humeral head is high-riding in almost articulates with the inferior surface of the acromion. There is advanced acromioclavicular joint osteoarthritis. There is marked fatty atrophy involving the supraspinatus and infraspinatus muscle bellies. Bone marrow signal intensity is normal. No mass is seen along the course of the suprascapular nerve, within the spinoglenoid notch or within the quadrilateral space.
IMPRESSION: 1. Large chronic full-thickness supraspinatus, infraspinatus and subscapularis tendon tears. 

2. Marked fatty atrophy involving the supraspinatus and infraspinatus muscle bellies. 

3. Completely torn and slightly retracted long head of biceps tendon. 

4. Complex superior labral tear. 

5. Advanced acromioclavicular joint osteoarthritis.

## 2020-09-28 ENCOUNTER — Ambulatory Visit (INDEPENDENT_AMBULATORY_CARE_PROVIDER_SITE_OTHER): Payer: Self-pay | Admitting: Hematology & Oncology

## 2021-05-08 ENCOUNTER — Other Ambulatory Visit (HOSPITAL_COMMUNITY): Payer: Self-pay

## 2021-05-08 LAB — EXTERNAL COVID-19 MOLECULAR RESULT: External 2019-n-CoV/SARS-CoV-2: POSITIVE — AB

## 2021-10-09 ENCOUNTER — Other Ambulatory Visit (INDEPENDENT_AMBULATORY_CARE_PROVIDER_SITE_OTHER): Payer: Self-pay | Admitting: Family Medicine

## 2021-10-09 DIAGNOSIS — Z8659 Personal history of other mental and behavioral disorders: Secondary | ICD-10-CM

## 2021-10-09 MED ORDER — PAROXETINE 20 MG TABLET
20.0000 mg | ORAL_TABLET | Freq: Every day | ORAL | 1 refills | Status: DC
Start: 2021-10-09 — End: 2023-05-18

## 2021-10-10 ENCOUNTER — Encounter (INDEPENDENT_AMBULATORY_CARE_PROVIDER_SITE_OTHER): Payer: Self-pay | Admitting: OTOLARYNGOLOGY

## 2021-10-10 ENCOUNTER — Ambulatory Visit (INDEPENDENT_AMBULATORY_CARE_PROVIDER_SITE_OTHER): Payer: Self-pay | Admitting: OTOLARYNGOLOGY

## 2021-10-10 ENCOUNTER — Ambulatory Visit (INDEPENDENT_AMBULATORY_CARE_PROVIDER_SITE_OTHER): Payer: Medicare Other | Admitting: OTOLARYNGOLOGY

## 2021-10-10 ENCOUNTER — Other Ambulatory Visit: Payer: Self-pay

## 2021-10-10 VITALS — Ht 67.0 in | Wt 165.0 lb

## 2021-10-10 DIAGNOSIS — H60399 Other infective otitis externa, unspecified ear: Secondary | ICD-10-CM | POA: Insufficient documentation

## 2021-10-10 DIAGNOSIS — H6063 Unspecified chronic otitis externa, bilateral: Secondary | ICD-10-CM

## 2021-10-10 DIAGNOSIS — H9190 Unspecified hearing loss, unspecified ear: Secondary | ICD-10-CM

## 2021-10-10 DIAGNOSIS — H6123 Impacted cerumen, bilateral: Secondary | ICD-10-CM

## 2021-10-10 DIAGNOSIS — H6120 Impacted cerumen, unspecified ear: Secondary | ICD-10-CM

## 2021-10-10 MED ORDER — FLUOCINOLONE ACETONIDE OIL 0.01 % EAR DROPS
OTIC | 2 refills | Status: AC
Start: 2021-10-10 — End: ?

## 2021-10-10 NOTE — H&P (Signed)
[facility], [place]  [place] [address]    History and Physical     Name: Steven Lam MRN:  [id number]   Date: 10/10/2021 Age: 85 y.o.          New Patient      Chief Complaint:    Chief Complaint   Patient presents with   . Hearing Problem     Complains of hearing loss and wax in ears. Also states having itching in ears.        HPI:  Steven Lam is a 86 y.o. male presenting for a new patient visit. Patient states he had been having itching ears, and hearing loss for several months.  Patient denies any otorrhea or otalgia.  Denies tinnitus.      Past Medical History:   Diagnosis Date   . Bipolar disorder, unspecified (CMS [place])    . Cancer (CMS [place])    . Essential hypertension    . Hearing loss    . Sleep apnea          Past Surgical History:   Procedure Laterality Date   . HX TONSILLECTOMY     . LAPAROSCOPIC CHOLECYSTECTOMY           Family Medical History:    None         Social History     Tobacco Use   . Smoking status: Former     Types: Cigarettes   . Smokeless tobacco: Never        Medications:  Current Outpatient Medications   Medication Sig   . cholecalciferol, vitamin D3, 25 mcg (1,000 unit) Oral Tablet Take 1 Tablet (1,000 Units total) by mouth   . Fluocinolone Acetonide Oil (DERMOTIC OIL) 0.01 % Otic Drops 4 gtts each ear bid x 7 days   . latanoprost (XALATAN) 0.005 % Ophthalmic Drops Instill 1 Drop into both eyes Every night   . lisinopriL (PRINIVIL) 10 mg Oral Tablet TAKE 1 TABLET BY MOUTH EVERY DAY FOR HYPERTENSION   . multivitamin with minerals Oral Tablet Take 1 Tablet by mouth   . PARoxetine (PAXIL) 20 mg Oral Tablet Take 1 Tablet (20 mg total) by mouth Once a day   . QUEtiapine (SEROQUEL) 25 mg Oral Tablet Take 0.5 Tablets (12.5 mg total) by mouth       Allergies:  Allergies   Allergen Reactions   . Aspirin      Other reaction(s): Other (See Comments)  Per patient "stomach bleed"       Review of Systems:  Review of Systems   Constitutional: Negative.    HENT: Positive  for hearing loss. Negative for ear discharge, ear pain, rhinorrhea and sinus pain.         Physical Exam:  Vitals:    10/10/21 1417   Weight: 74.8 kg (165 lb)   Height: 1.702 m ('5\' 7"'$ )   BMI: 25.9      ENT Physical Exam  Constitutional  Appearance: patient appears well-developed, well-nourished and well-groomed,  Communication/Voice: communication appropriate for developmental age; vocal quality normal;  Head and Face  Appearance: head appears normal, face appears normal and face appears atraumatic;  Palpation: facial palpation normal;  Salivary: glands normal;  Ear  Hearing: intact;  Auricles: right auricle normal; left auricle normal;  External Mastoids: right external mastoid normal; left external mastoid normal;  Ear Canals: bilateral ear canals impacted cerumen observed;  Tympanic Membranes: right tympanic membrane normal; left tympanic membrane normal;  Nose  External Nose: nares patent bilaterally; external nose normal;  Internal Nose: nasal mucosa normal; septum normal; bilateral inferior turbinates normal;  Oral Cavity/Oropharynx  Lips: normal;  Teeth: normal;  Gums: gingiva normal;  Tongue: normal;  Oral mucosa: normal;  Hard palate: normal;  Soft palate: normal;  Tonsils: normal;  Base of Tongue: normal;  Posterior pharyngeal wall: normal;  Neck  Neck: neck normal; neck palpation normal;  Thyroid: thyroid normal;  Respiratory  Inspection: breathing unlabored; normal breathing rate;  Lymphatic  Palpation: lymph nodes normal;  Neurovestibular  Mental Status: alert and oriented;  Psychiatric: mood normal; affect is appropriate;  Cranial Nerves: cranial nerves intact;       Assessment and Plan:    ICD-10-CM    1. Impacted cerumen, unspecified laterality  H61.20 [id number] - REMOVAL IMPACTED CERUMEN W/ INSTRUMENT, UNILATERAL (AMB ONLY-PD)      2. Hearing loss, unspecified hearing loss type, unspecified laterality  H91.90 Referral to [facility]      3. Chronic otitis externa of both ears   H60.63         Orders Placed This Encounter   . [id number] - REMOVAL IMPACTED CERUMEN W/ INSTRUMENT, UNILATERAL (AMB ONLY-PD)   . Referral to [facility]   . Fluocinolone Acetonide Oil (DERMOTIC OIL) 0.01 % Otic Drops        Follow Up:  No follow-ups on file.     Dia Sitter, DO

## 2021-10-10 NOTE — Procedures (Signed)
[facility], [facility]  [place] [address]    Procedure Note    Name: Harvy Riera MRN:  [id number]   Date: 10/10/2021 Age: 86 y.o.       [phone] - REMOVAL IMPACTED CERUMEN W/ INSTRUMENT, UNILATERAL (AMB ONLY-PD)    Date/Time: 10/10/2021 2:44 PM  Performed by: Dia Sitter, DO  Authorized by: Dia Sitter, DO     Time Out:     Immediately before the procedure, a time out was called:  Yes    Patient verified:  Yes    Procedure Verified:  Yes    Site Verified:  Yes  Documentation:      Cerumen removed with curette and microscope bilaterally        Dia Sitter, DO

## 2021-10-11 ENCOUNTER — Other Ambulatory Visit (INDEPENDENT_AMBULATORY_CARE_PROVIDER_SITE_OTHER): Payer: Self-pay | Admitting: Family Medicine

## 2021-11-28 ENCOUNTER — Encounter (INDEPENDENT_AMBULATORY_CARE_PROVIDER_SITE_OTHER): Payer: Self-pay | Admitting: OTOLARYNGOLOGY

## 2021-12-05 ENCOUNTER — Other Ambulatory Visit (INDEPENDENT_AMBULATORY_CARE_PROVIDER_SITE_OTHER): Payer: Self-pay | Admitting: Family Medicine

## 2021-12-11 ENCOUNTER — Other Ambulatory Visit (INDEPENDENT_AMBULATORY_CARE_PROVIDER_SITE_OTHER): Payer: Self-pay | Admitting: Family Medicine

## 2022-02-17 ENCOUNTER — Other Ambulatory Visit: Payer: Medicare Other | Attending: Family Medicine | Admitting: Family Medicine

## 2022-02-17 ENCOUNTER — Ambulatory Visit (INDEPENDENT_AMBULATORY_CARE_PROVIDER_SITE_OTHER): Payer: Medicare Other

## 2022-02-17 ENCOUNTER — Other Ambulatory Visit: Payer: Self-pay

## 2022-02-17 DIAGNOSIS — R399 Unspecified symptoms and signs involving the genitourinary system: Secondary | ICD-10-CM

## 2022-02-17 DIAGNOSIS — E782 Mixed hyperlipidemia: Secondary | ICD-10-CM

## 2022-02-17 DIAGNOSIS — E785 Hyperlipidemia, unspecified: Secondary | ICD-10-CM

## 2022-02-17 DIAGNOSIS — I10 Essential (primary) hypertension: Secondary | ICD-10-CM | POA: Insufficient documentation

## 2022-02-17 LAB — HEPATIC FUNCTION PANEL
ALBUMIN/GLOBULIN RATIO: 1.6 — ABNORMAL HIGH (ref 0.8–1.4)
ALBUMIN: 4.1 g/dL (ref 3.5–5.7)
ALKALINE PHOSPHATASE: 85 U/L (ref 34–104)
ALT (SGPT): 17 U/L (ref 7–52)
AST (SGOT): 20 U/L (ref 13–39)
BILIRUBIN DIRECT: 0.11 md/dL (ref <=0.20)
BILIRUBIN TOTAL: 0.5 mg/dL (ref 0.3–1.2)
BILIRUBIN, INDIRECT: 0.39 mg/dL (ref <=1)
GLOBULIN: 2.6 — ABNORMAL LOW (ref 2.9–5.4)
PROTEIN TOTAL: 6.7 g/dL (ref 6.4–8.9)

## 2022-02-17 LAB — BASIC METABOLIC PANEL
ANION GAP: 6 mmol/L — ABNORMAL LOW (ref 10–20)
BUN/CREA RATIO: 20 (ref 6–22)
BUN: 19 mg/dL (ref 7–25)
CALCIUM: 9.5 mg/dL (ref 8.6–10.3)
CHLORIDE: 106 mmol/L (ref 98–107)
CO2 TOTAL: 30 mmol/L (ref 21–31)
CREATININE: 0.95 mg/dL (ref 0.60–1.30)
ESTIMATED GFR: 79 mL/min/{1.73_m2} (ref >59)
GLUCOSE: 96 mg/dL (ref 74–109)
OSMOLALITY, CALCULATED: 285 mOsm/kg (ref 270–290)
POTASSIUM: 4.5 mmol/L (ref 3.5–5.1)
SODIUM: 142 mmol/L (ref 136–145)

## 2022-02-17 LAB — CBC
HCT: 45 % (ref 42.0–51.0)
HGB: 15 g/dL (ref 13.5–18.0)
MCH: 29.3 pg (ref 27.0–32.0)
MCHC: 33.4 g/dL (ref 32.0–36.0)
MCV: 87.9 fL (ref 78.0–99.0)
MPV: 8.5 fL (ref 7.4–10.4)
PLATELETS: 264 10*3/uL (ref 140–440)
RBC: 5.12 10*6/uL (ref 4.20–6.00)
RDW: 14.6 % (ref 11.6–14.8)
WBC: 5.3 10*3/uL (ref 4.0–10.5)
WBCS UNCORRECTED: 5.3 10*3/uL

## 2022-02-17 LAB — THYROID STIMULATING HORMONE (SENSITIVE TSH): TSH: 2.121 u[IU]/mL (ref 0.450–5.330)

## 2022-02-17 LAB — LIPID PANEL
CHOL/HDL RATIO: 4.2
CHOLESTEROL: 229 mg/dL — ABNORMAL HIGH (ref <200)
HDL CHOL: 55 mg/dL (ref 23–92)
LDL CALC: 147 mg/dL — ABNORMAL HIGH (ref 0–100)
TRIGLYCERIDES: 133 mg/dL (ref <=150)
VLDL CALC: 27 mg/dL (ref 0–50)

## 2022-02-17 LAB — URINALYSIS, MACROSCOPIC
BILIRUBIN: NEGATIVE mg/dL
BLOOD: NEGATIVE mg/dL
GLUCOSE: NEGATIVE mg/dL
KETONES: NEGATIVE mg/dL
LEUKOCYTES: NEGATIVE WBCs/uL
NITRITE: NEGATIVE
PH: 5.5 (ref 5.0–9.0)
PROTEIN: NEGATIVE mg/dL
SPECIFIC GRAVITY: 1.02 (ref 1.002–1.030)
UROBILINOGEN: NORMAL mg/dL

## 2022-02-17 LAB — URINALYSIS, MICROSCOPIC
RBCS: 2 /hpf (ref <4)
WBCS: 2 /hpf (ref <6)

## 2022-02-21 ENCOUNTER — Encounter (INDEPENDENT_AMBULATORY_CARE_PROVIDER_SITE_OTHER): Payer: Self-pay | Admitting: Family Medicine

## 2022-02-26 ENCOUNTER — Ambulatory Visit (INDEPENDENT_AMBULATORY_CARE_PROVIDER_SITE_OTHER): Payer: Medicare Other | Admitting: Family Medicine

## 2022-02-26 ENCOUNTER — Encounter (INDEPENDENT_AMBULATORY_CARE_PROVIDER_SITE_OTHER): Payer: Self-pay | Admitting: Family Medicine

## 2022-02-26 ENCOUNTER — Other Ambulatory Visit: Payer: Self-pay

## 2022-02-26 VITALS — BP 148/85 | HR 77 | Temp 98.1°F | Resp 18 | Ht 67.0 in | Wt 174.0 lb

## 2022-02-26 DIAGNOSIS — I10 Essential (primary) hypertension: Secondary | ICD-10-CM

## 2022-02-26 DIAGNOSIS — F331 Major depressive disorder, recurrent, moderate: Secondary | ICD-10-CM

## 2022-02-26 DIAGNOSIS — G4733 Obstructive sleep apnea (adult) (pediatric): Secondary | ICD-10-CM

## 2022-02-26 DIAGNOSIS — H409 Unspecified glaucoma: Secondary | ICD-10-CM

## 2022-02-26 DIAGNOSIS — M7989 Other specified soft tissue disorders: Secondary | ICD-10-CM

## 2022-02-26 DIAGNOSIS — Z9989 Dependence on other enabling machines and devices: Secondary | ICD-10-CM

## 2022-02-26 MED ORDER — TAMSULOSIN 0.4 MG CAPSULE
0.4000 mg | ORAL_CAPSULE | Freq: Every day | ORAL | 3 refills | Status: AC
Start: 2022-02-26 — End: ?

## 2022-02-26 NOTE — Nursing Note (Signed)
02/26/22 1145   Fall Risk Assessment   Do you feel unsteady when standing or walking? Yes   Do you worry about falling? Yes   Have you fallen in the past year? No

## 2022-02-26 NOTE — Nursing Note (Signed)
02/26/22 1146   PHQ 9 (follow up)   Little interest or pleasure in doing things. 0   Feeling down, depressed, or hopeless 0

## 2022-02-26 NOTE — Progress Notes (Signed)
FAMILY MEDICINE, MEDICAL OFFICE BUILDING  [place] [address]       Name: Steven Lam MRN:  [id number]   Date: 02/26/2022 Age: 86 y.o.          Provider: Elliot Gault, DO    Reason for visit: Follow Up 6 Months      History of Present Illness:  02/26/2022:  This 86 year old male returns for six-month follow-up to review his labs and get refills on all his medications.  He is gained 9 lb since he was here last.  He saw Dr. Dema Severin cell who recommended a hearing test and sit him to [facility] but he canceled it going to go to [facility] to get a hearing test he was requesting his ears flushed out but they are not stopped up.  The patient stop his Flomax couple of months ago and he is not only the bathroom as well.  He is 2+ pitting edema of the lower extremities.  His CBC was normal with a hemoglobin of 15.8 hematocrit 45.0 electrolytes were normal with a glucose of 96 a BUN 19 creatinine 0.95 GFR was 76 and calcium 9.5 cholesterol high at 229 HDL 55 LDL 147 triglycerides 133 and his cholesterol medication he was refused.  TSH was 2.1-1 liver enzymes are normal urinalysis was normal.  He denies chest pain or shortness a breath and is tolerating his medications well.  He is no nausea vomiting diarrhea constipation.  He does have 2+ pitting edema of the lower extremities  Historical Data    Past Medical History:  Past Medical History:   Diagnosis Date   . Asymptomatic PVCs    . B12 deficiency    . Bipolar disorder, unspecified (CMS [place])    . BPH associated with nocturia    . Cancer (CMS [place])    . Degenerative cervical spinal stenosis    . Eczema of both external ears    . Essential hypertension    . Gross hematuria    . Hearing loss    . History of COVID-19    . History of rectal fissure    . Hydronephrosis    . Impacted cerumen of both ears    . Left rotator cuff tear    . Lower extremity weakness    . Mixed hyperlipidemia    . Orthostatic hypotension    . Parkinson's disease (CMS  [place])    . Sleep apnea    . Spinal curvature    . 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 Upper extremity weakness    . Vitamin D deficiency          Past Surgical History:  Past Surgical History:   Procedure Laterality Date   . HX BACK SURGERY      x3   . HX CATARACT REMOVAL     . HX CHOLECYSTECTOMY     . HX HIP REPLACEMENT Left    . HX TONSILLECTOMY     . LAPAROSCOPIC CHOLECYSTECTOMY           Allergies:  Allergies   Allergen Reactions   . Aspirin      Other reaction(s): Other (See Comments)  Per patient "stomach bleed"     Medications:  Current Outpatient Medications   Medication Sig   . cholecalciferol, vitamin D3, 25 mcg (1,000 unit) Oral Tablet Take 1 Tablet (1,000 Units total) by mouth   . Fluocinolone Acetonide Oil (DERMOTIC OIL) 0.01 % Otic Drops 4 gtts each  ear bid x 7 days   . latanoprost (XALATAN) 0.005 % Ophthalmic Drops Instill 1 Drop into both eyes Every night   . lisinopriL (PRINIVIL) 10 mg Oral Tablet TAKE 1 TABLET BY MOUTH EVERY DAY FOR HYPERTENSION   . multivitamin with minerals Oral Tablet Take 1 Tablet by mouth   . PARoxetine (PAXIL) 20 mg Oral Tablet Take 1 Tablet (20 mg total) by mouth Once a day   . QUEtiapine (SEROQUEL) 25 mg Oral Tablet TAKE 1/2 TABLET BY MOUTH AT BEDTIME   . tamsulosin (FLOMAX) 0.4 mg Oral Capsule Take 1 Capsule (0.4 mg total) by mouth Once a day     Family History:  Family Medical History:     Problem Relation (Age of Onset)    Colon Cancer Mother    Dementia Mother    Diabetes type II Brother    Heart Attack Father, Brother          Social History:  Social History     Socioeconomic History   . Marital status: Divorced   Tobacco Use   . Smoking status: Former     Packs/day: 1.00     Years: 10.00     Pack years: 10.00     Types: Cigarettes     Quit date: 08/04/1968     Years since quitting: 53.6   . Smokeless tobacco: Never   Vaping Use   . Vaping Use: Never used   Substance and Sexual Activity   . Alcohol use: Never   . Drug use: Never           Review of Systems:  Any pertinent  Review of Systems as addressed in the HPI above.    Physical Exam:  Vital Signs:  Vitals:    02/26/22 1142   BP: (!) 148/85   Pulse: 77   Resp: 18   Temp: 36.7 C (98.1 F)   TempSrc: Temporal   SpO2: 96%   Weight: 78.9 kg (174 lb)   Height: 1.702 m ('5\' 7"'$ )   BMI: 27.31     Physical Exam  Constitutional:       Appearance: Normal appearance. He is overweight.      Comments: BMI is 27.25 kilograms/meter squared which is in the overweight range   HENT:      Head: Normocephalic and atraumatic.      Right Ear: Tympanic membrane, ear canal and external ear normal.      Left Ear: Tympanic membrane, ear canal and external ear normal.      Nose: Nose normal.      Mouth/Throat:      Mouth: Mucous membranes are moist.      Pharynx: Oropharynx is clear.   Eyes:      Extraocular Movements: Extraocular movements intact.      Conjunctiva/sclera: Conjunctivae normal.      Pupils: Pupils are equal, round, and reactive to light.   Cardiovascular:      Rate and Rhythm: Normal rate and regular rhythm.      Pulses: Normal pulses.      Heart sounds: S1 normal and S2 normal. Murmur heard.    Systolic murmur is present with a grade of 2/6.  Pulmonary:      Effort: Pulmonary effort is normal.      Breath sounds: Normal breath sounds.   Abdominal:      General: Abdomen is protuberant.      Palpations: Abdomen is soft.   Musculoskeletal:      Cervical  back: Rigidity, spasms and tenderness present. Decreased range of motion.      Thoracic back: Swelling and spasms present. Decreased range of motion. Scoliosis present.      Lumbar back: Spasms and tenderness present. Decreased range of motion. Scoliosis present.      Right lower leg: 2+ Pitting Edema present.      Left lower leg: 2+ Pitting Edema present.   Skin:     General: Skin is warm and dry.      Capillary Refill: Capillary refill takes less than 2 seconds.   Neurological:      General: No focal deficit present.      Mental Status: He is alert and oriented to person, place, and time.  Mental status is at baseline.   Psychiatric:         Mood and Affect: Mood normal.         Behavior: Behavior normal.       Assessment:    ICD-10-CM    1. MDD (major depressive disorder), recurrent episode, moderate (CMS [facility])  F33.1       2. Localized swelling of lower extremity  M79.89       3. OSA on CPAP  G47.33     Z99.89       4. Glaucoma, unspecified glaucoma type, unspecified laterality  H40.9       5. Essential hypertension  I10 CBC/DIFF     BASIC METABOLIC PANEL     URINALYSIS, MACROSCOPIC AND MICROSCOPIC W/CULTURE REFLEX     LIPID PANEL     HEPATIC FUNCTION PANEL     THYROID STIMULATING HORMONE (SENSITIVE TSH)         Plan:  Orders Placed This Encounter   . CBC/DIFF   . BASIC METABOLIC PANEL   . URINALYSIS, MACROSCOPIC AND MICROSCOPIC W/CULTURE REFLEX   . LIPID PANEL   . HEPATIC FUNCTION PANEL   . THYROID STIMULATING HORMONE (SENSITIVE TSH)   . tamsulosin (FLOMAX) 0.4 mg Oral Capsule     Today for 6 months from now lowered a CBC basic metabolic panel urinalysis lipid panel hepatic function panel thyroid-stimulating hormone refilled his Flomax.  I will stay on Flomax.  If the swelling continues we will need to add some Lasix but in the meantime I want him to limit his salt to no more than 2 g a day elevate his legs wear compression stockings.  Both ears were open and I could see the eardrums they do not need to be irrigated today there is a little bit of wax present.  He will continue to stay on a heart healthy low-fat low-cholesterol diet and avoid high fructose corn syrup and concentrated sweets.  His mood is pretty good although he does not always do exactly what his wife tells him to do.  More than 50% of the visit was spent counseling and coordinating care.  All questions were answered to his satisfaction of the patient.  I recommended he continue the derm otic oil as well as his lisinopril for hypertension.    Return in about 6 months (around 08/29/2022).    Elliot Gault, DO     Portions of this note  may be dictated using voice recognition software or a dictation service. Variances in spelling and vocabulary are possible and unintentional. Not all errors are caught/corrected. Please notify the [facility] if any discrepancies are noted or if the meaning of any statement is not clear.

## 2022-04-06 ENCOUNTER — Other Ambulatory Visit (INDEPENDENT_AMBULATORY_CARE_PROVIDER_SITE_OTHER): Payer: Self-pay | Admitting: Family Medicine

## 2022-04-20 ENCOUNTER — Other Ambulatory Visit (INDEPENDENT_AMBULATORY_CARE_PROVIDER_SITE_OTHER): Payer: Self-pay | Admitting: Family Medicine

## 2022-06-01 ENCOUNTER — Other Ambulatory Visit (INDEPENDENT_AMBULATORY_CARE_PROVIDER_SITE_OTHER): Payer: Self-pay | Admitting: Family Medicine

## 2022-06-08 ENCOUNTER — Other Ambulatory Visit (INDEPENDENT_AMBULATORY_CARE_PROVIDER_SITE_OTHER): Payer: Self-pay | Admitting: Family Medicine

## 2022-08-29 ENCOUNTER — Encounter (INDEPENDENT_AMBULATORY_CARE_PROVIDER_SITE_OTHER): Payer: Self-pay | Admitting: Family Medicine

## 2022-10-09 ENCOUNTER — Other Ambulatory Visit: Payer: Self-pay

## 2022-10-09 ENCOUNTER — Ambulatory Visit (INDEPENDENT_AMBULATORY_CARE_PROVIDER_SITE_OTHER): Payer: Medicare Other

## 2022-10-09 ENCOUNTER — Other Ambulatory Visit: Payer: Medicare Other | Attending: Family Medicine | Admitting: Family Medicine

## 2022-10-09 DIAGNOSIS — I10 Essential (primary) hypertension: Secondary | ICD-10-CM | POA: Insufficient documentation

## 2022-10-09 LAB — BASIC METABOLIC PANEL
ANION GAP: 5 mmol/L (ref 4–13)
BUN/CREA RATIO: 19 (ref 6–22)
BUN: 16 mg/dL (ref 7–25)
CALCIUM: 9.5 mg/dL (ref 8.6–10.3)
CHLORIDE: 105 mmol/L (ref 98–107)
CO2 TOTAL: 32 mmol/L — ABNORMAL HIGH (ref 21–31)
CREATININE: 0.85 mg/dL (ref 0.60–1.30)
ESTIMATED GFR: 85 mL/min/{1.73_m2} (ref >59)
GLUCOSE: 100 mg/dL (ref 74–109)
OSMOLALITY, CALCULATED: 284 mOsm/kg (ref 270–290)
POTASSIUM: 4.4 mmol/L (ref 3.5–5.1)
SODIUM: 142 mmol/L (ref 136–145)

## 2022-10-09 LAB — HEPATIC FUNCTION PANEL
ALBUMIN/GLOBULIN RATIO: 1.4 (ref 0.8–1.4)
ALBUMIN: 4 g/dL (ref 3.5–5.7)
ALKALINE PHOSPHATASE: 95 U/L (ref 34–104)
ALT (SGPT): 19 U/L (ref 7–52)
AST (SGOT): 20 U/L (ref 13–39)
BILIRUBIN DIRECT: 0.06 md/dL (ref <=0.20)
BILIRUBIN TOTAL: 0.4 mg/dL (ref 0.3–1.2)
BILIRUBIN, INDIRECT: 0.34 mg/dL (ref <=1)
GLOBULIN: 2.8 — ABNORMAL LOW (ref 2.9–5.4)
PROTEIN TOTAL: 6.8 g/dL (ref 6.4–8.9)

## 2022-10-09 LAB — URINALYSIS, MACROSCOPIC
BILIRUBIN: NEGATIVE mg/dL
BLOOD: NEGATIVE mg/dL
GLUCOSE: NEGATIVE mg/dL
KETONES: NEGATIVE mg/dL
LEUKOCYTES: NEGATIVE WBCs/uL
NITRITE: NEGATIVE
PH: 5.5 (ref 5.0–9.0)
PROTEIN: NEGATIVE mg/dL
SPECIFIC GRAVITY: 1.018 (ref 1.002–1.030)
UROBILINOGEN: NORMAL mg/dL

## 2022-10-09 LAB — LIPID PANEL
CHOL/HDL RATIO: 4
CHOLESTEROL: 213 mg/dL — ABNORMAL HIGH (ref <200)
HDL CHOL: 53 mg/dL (ref 23–92)
LDL CALC: 128 mg/dL — ABNORMAL HIGH (ref 0–100)
TRIGLYCERIDES: 161 mg/dL — ABNORMAL HIGH (ref <=150)
VLDL CALC: 32 mg/dL (ref 0–50)

## 2022-10-09 LAB — URINALYSIS, MICROSCOPIC
HYALINE CASTS: 4 /lpf — ABNORMAL HIGH (ref <0)
RBCS: 1 /hpf (ref <4)
WBCS: 10 /hpf — ABNORMAL HIGH (ref <6)

## 2022-10-09 LAB — THYROID STIMULATING HORMONE (SENSITIVE TSH): TSH: 2.096 u[IU]/mL (ref 0.450–5.330)

## 2022-10-11 LAB — URINE CULTURE,ROUTINE: URINE CULTURE: NO GROWTH

## 2022-10-14 ENCOUNTER — Encounter (INDEPENDENT_AMBULATORY_CARE_PROVIDER_SITE_OTHER): Payer: Self-pay | Admitting: Family Medicine

## 2022-10-14 ENCOUNTER — Ambulatory Visit (INDEPENDENT_AMBULATORY_CARE_PROVIDER_SITE_OTHER): Payer: Medicare Other | Admitting: Family Medicine

## 2022-10-14 ENCOUNTER — Other Ambulatory Visit: Payer: Self-pay

## 2022-10-14 VITALS — BP 124/71 | HR 78 | Temp 98.0°F | Resp 19 | Ht 67.0 in | Wt 171.0 lb

## 2022-10-14 DIAGNOSIS — H6123 Impacted cerumen, bilateral: Secondary | ICD-10-CM

## 2022-10-14 DIAGNOSIS — R000 Tachycardia, unspecified: Secondary | ICD-10-CM

## 2022-10-14 DIAGNOSIS — Z9181 History of falling: Secondary | ICD-10-CM | POA: Insufficient documentation

## 2022-10-14 DIAGNOSIS — I872 Venous insufficiency (chronic) (peripheral): Secondary | ICD-10-CM | POA: Insufficient documentation

## 2022-10-14 DIAGNOSIS — M40203 Unspecified kyphosis, cervicothoracic region: Secondary | ICD-10-CM

## 2022-10-14 DIAGNOSIS — G319 Degenerative disease of nervous system, unspecified: Secondary | ICD-10-CM | POA: Insufficient documentation

## 2022-10-14 DIAGNOSIS — Z981 Arthrodesis status: Secondary | ICD-10-CM

## 2022-10-14 DIAGNOSIS — R29818 Other symptoms and signs involving the nervous system: Secondary | ICD-10-CM | POA: Insufficient documentation

## 2022-10-14 DIAGNOSIS — I10 Essential (primary) hypertension: Secondary | ICD-10-CM

## 2022-10-14 DIAGNOSIS — F331 Major depressive disorder, recurrent, moderate: Secondary | ICD-10-CM

## 2022-10-14 DIAGNOSIS — M4802 Spinal stenosis, cervical region: Secondary | ICD-10-CM | POA: Insufficient documentation

## 2022-10-14 DIAGNOSIS — R2689 Other abnormalities of gait and mobility: Secondary | ICD-10-CM

## 2022-10-14 MED ORDER — TRIAMCINOLONE ACETONIDE 0.1 % TOPICAL CREAM
TOPICAL_CREAM | Freq: Two times a day (BID) | CUTANEOUS | 4 refills | Status: AC
Start: 2022-10-14 — End: ?

## 2022-10-14 NOTE — Nursing Note (Signed)
10/14/22 1147   Health Education and Literacy   How often do you have a problem understanding what is told to you about your medical condition?  Sometimes   Domestic Violence   Because we are aware of abuse and domestic violence today, we ask all patients: Are you being hurt, hit, or frightened by anyone at your home or in your life?  N   Basic Needs   Do you have any basic needs within your home that are not being met? (such as Food, Shelter, Games developer, Tranportation, paying for bills and/or medications) N   Advanced Directives   Do you have any advanced directives? MPOA  (unsure if on file at hospital)

## 2022-10-14 NOTE — Nursing Note (Signed)
10/14/22 1305   PHQ 9 (follow up)   Little interest or pleasure in doing things. 1   Feeling down, depressed, or hopeless 1   Trouble falling or staying asleep, or sleeping too much. 0   Feeling tired or having little energy 2   Poor appetite or overeating 0   Feeling bad about yourself/ that you are a failure in the past 2 weeks? 1   Trouble concentrating on things in the past 2 weeks? 1   Moving/Speaking slowly or being fidgety or restless  in the past 2 weeks? 0   Thoughts that you would be better off DEAD, or of hurting yourself in some way. 0   PHQ 9 Total 6   Interpretation of Total Score Mild depression

## 2022-10-14 NOTE — Addendum Note (Signed)
Addended by: Shelda Jakes on: 10/14/2022 12:40 PM     Modules accepted: Orders

## 2022-10-14 NOTE — Nursing Note (Signed)
10/14/22 1148   Fall Risk Assessment   Do you feel unsteady when standing or walking? Yes  (using walker)   Do you worry about falling? Yes   Have you fallen in the past year? No

## 2022-10-14 NOTE — Progress Notes (Signed)
FAMILY MEDICINE, MEDICAL OFFICE BUILDING  [place] [id number]       Name: Steven Lam MRN:  [id number]   Date: 10/14/2022 Age: 87 y.o.          Provider: Elliot Gault, DO    Reason for visit: Follow Up 6 Months      History of Present Illness:  10/14/2022:  This 87 year old male returns for six-month follow-up to review his labs and get refills on all his medication.  His ex-wife states that his ears are stopped up and itching in his more hard of hearing.  His shuffling gait is worse he saw Dr. Laurena Bering last summer but she does not want to taking back there again.  He has a rash on both legs right worse than left which appear to be venous stasis changes.  His PHQ-9 today was 6.  It takes him up to 20 minutes  to void.  He was on parkinsonian medications but was taken off by Dr. Arlan Organ.  He still has parkinsonian features.  On today's labs his CO2 is slightly elevated retained CO2 in his sodium was 142 potassium 4.4 glucose 100 BUN 16 creatinine 0.85 GFR 85 cholesterol was a little bit better at 2:13 a.m. HDL 53 LDL 128 triglycerides 161 TSH was 2.096 liver enzymes are normal urinalysis is normal.  He has symptoms of neurogenic bladder.  Historical Data    Past Medical History:  Past Medical History:   Diagnosis Date    Asymptomatic PVCs     B12 deficiency     Bipolar disorder, unspecified (CMS HCC)     BPH associated with nocturia     Cancer (CMS HCC)     Degenerative cervical spinal stenosis     Eczema of both external ears     Essential hypertension     Gross hematuria     Hearing loss     History of COVID-19     History of rectal fissure     Hydronephrosis     Impacted cerumen of both ears     Left rotator cuff tear     Lower extremity weakness     Mixed hyperlipidemia     Orthostatic hypotension     Parkinson's disease     Sleep apnea     Spinal curvature     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)     Upper extremity weakness     Vitamin D deficiency          Past Surgical History:  Past  Surgical History:   Procedure Laterality Date    HX BACK SURGERY      x3    HX CATARACT REMOVAL      HX CHOLECYSTECTOMY      HX HIP REPLACEMENT Left     HX TONSILLECTOMY      LAPAROSCOPIC CHOLECYSTECTOMY           Allergies:  Allergies   Allergen Reactions    Aspirin      Other reaction(s): Other (See Comments)  Per patient "stomach bleed"     Medications:  Current Outpatient Medications   Medication Sig    cholecalciferol, vitamin D3, 25 mcg (1,000 unit) Oral Tablet Take 1 Tablet (1,000 Units total) by mouth    Fluocinolone Acetonide Oil (DERMOTIC OIL) 0.01 % Otic Drops 4 gtts each ear bid x 7 days    latanoprost (XALATAN) 0.005 % Ophthalmic Drops INSTILL 1 DROP INTO BOTH EYES AT BEDTIME    lisinopriL (PRINIVIL) 10  mg Oral Tablet TAKE 1 TABLET BY MOUTH EVERY DAY FOR BLOOD PRESSURE    multivitamin with minerals Oral Tablet Take 1 Tablet by mouth    PARoxetine (PAXIL) 20 mg Oral Tablet Take 1 Tablet (20 mg total) by mouth Once a day    QUEtiapine (SEROQUEL) 25 mg Oral Tablet TAKE 1/2 TABLET BY MOUTH EVERY DAY AT BEDTIME (Patient taking differently: Take 1 Tablet (25 mg total) by mouth Every night)    tamsulosin (FLOMAX) 0.4 mg Oral Capsule Take 1 Capsule (0.4 mg total) by mouth Once a day    triamcinolone acetonide 0.1 % Cream Apply topically Twice daily     Family History:  Family Medical History:       Problem Relation (Age of Onset)    Colon Cancer Mother    Dementia Mother    Diabetes type II Brother    Heart Attack Father, Brother            Social History:  Social History     Socioeconomic History    Marital status: Divorced   Tobacco Use    Smoking status: Former     Current packs/day: 0.00     Average packs/day: 1 pack/day for 10.0 years (10.0 ttl pk-yrs)     Types: Cigarettes     Start date: 08/04/1958     Quit date: 08/04/1968     Years since quitting: 54.2    Smokeless tobacco: Never   Vaping Use    Vaping status: Never Used   Substance and Sexual Activity    Alcohol use: Never    Drug use: Never            Review of Systems:  Any pertinent Review of Systems as addressed in the HPI above.    Physical Exam:  Vital Signs:  Vitals:    10/14/22 1143   BP: 124/71   Pulse: 78   Resp: 19   Temp: 36.7 C (98 F)   TempSrc: Temporal   SpO2: 98%   Weight: 77.6 kg (171 lb)   Height: 1.702 m ('5\' 7"'$ )   BMI: 26.84     Physical Exam  Vitals and nursing note reviewed.   Constitutional:       General: He is awake.      Appearance: Normal appearance. He is well-developed, well-groomed and overweight.      Comments: BMI is elevated at 26.78 kg per m2 which is in the overweight range.   HENT:      Head: Normocephalic and atraumatic.      Right Ear: Tympanic membrane, ear canal and external ear normal. Decreased hearing noted. There is impacted cerumen.      Left Ear: Tympanic membrane, ear canal and external ear normal. Decreased hearing noted. There is impacted cerumen.      Nose: Nose normal.      Mouth/Throat:      Mouth: Mucous membranes are moist.      Pharynx: Oropharynx is clear.   Eyes:      Extraocular Movements: Extraocular movements intact.      Conjunctiva/sclera: Conjunctivae normal.      Pupils: Pupils are equal, round, and reactive to light.   Cardiovascular:      Rate and Rhythm: Normal rate and regular rhythm.      Pulses: Normal pulses.      Heart sounds: Normal heart sounds, S1 normal and S2 normal.   Pulmonary:      Effort: Pulmonary effort is normal.  Breath sounds: Normal breath sounds.   Abdominal:      General: Abdomen is flat.      Palpations: Abdomen is soft.   Musculoskeletal:      Cervical back: Deformity, rigidity and spasms present. Decreased range of motion.      Thoracic back: Deformity, spasms and tenderness present. Decreased range of motion.      Lumbar back: Spasms, tenderness and bony tenderness present. Decreased range of motion.   Skin:     General: Skin is warm and dry.      Capillary Refill: Capillary refill takes less than 2 seconds.   Neurological:      General: No focal deficit present.       Mental Status: He is alert and oriented to person, place, and time. Mental status is at baseline.      GCS: GCS eye subscore is 4. GCS verbal subscore is 5. GCS motor subscore is 6.      Cranial Nerves: Dysarthria present.      Motor: Weakness and abnormal muscle tone present.      Coordination: Romberg sign positive.      Gait: Gait abnormal.      Comments: Patient walks with a walker with a shuffling gait   Psychiatric:         Mood and Affect: Mood normal.         Behavior: Behavior normal. Behavior is cooperative.       Assessment:    ICD-10-CM    1. MDD (major depressive disorder), recurrent episode, moderate (CMS HCC)  F33.1       2. Degenerative disease of nervous system, unspecified (CMS HCC)  G31.9       3. Essential hypertension  I10 LIPID PANEL     HEPATIC FUNCTION PANEL     CBC/DIFF     URINALYSIS, MACROSCOPIC AND MICROSCOPIC W/CULTURE REFLEX     BASIC METABOLIC PANEL     THYROID STIMULATING HORMONE (SENSITIVE TSH)      4. Sinus tachycardia  R00.0       5. Kyphosis of cervicothoracic region, unspecified kyphosis type  M40.203       6. S/P spinal fusion  Z98.1       7. At high risk for injury related to fall  Z91.81       8. Venous stasis dermatitis of both lower extremities  I87.2       9. Parkinsonian features  R29.818 Refer to [place] [facility]      10. Cervical stenosis of spinal canal  M48.02 Refer to [place] [facility]      11. Impacted cerumen of both ears  H61.23 POCT EAR IRRIGATION/LAVAGE, NO INSTRUMENTATION - CLINICAL SUPPORT STAFF PERFORMED      12. Balance disorder  R26.89          Plan:  Orders Placed This Encounter    LIPID PANEL    HEPATIC FUNCTION PANEL    CBC/DIFF    URINALYSIS, MACROSCOPIC AND MICROSCOPIC W/CULTURE REFLEX    BASIC METABOLIC PANEL    THYROID STIMULATING HORMONE (SENSITIVE TSH)    Refer to NEUROLOGY - [place] - [facility]    POCT EAR IRRIGATION/LAVAGE, NO INSTRUMENTATION - CLINICAL SUPPORT STAFF PERFORMED    triamcinolone acetonide 0.1 % Cream     Today  we successfully irrigated the patient's ears.  Today for 6 months from now I have ordered a lipid panel hepatic function panel CBC with diff urinalysis basic metabolic panel  thyroid-stimulating hormone.  We are going to refer the patient to Dr. Tobie Poet better delineate whether his problems are parkinsonian or cervical spinal stenosis or other neurological degenerative disease.  I am going to give him triamcinolone cream to put on his legs which will be put on under support stockings.  I think the patient would benefit from physical therapy as an inpatient at [facility] or [facility].  The patient has a high risk for injury related to falls.  He has a balance disorder.  He may also benefit from a referral to Urology for his delay in urination.  He will continue the Paxil 20 mg because it does seem to helped we will see him back in a month to re-evaluate the rash on his legs.  I reviewed the 2 MRIs of his lumbar spine and thoracic spine from 2015 there is not any new MRIs of the cervical spine.  [facility] but this was not very successful at the time.  Will try to get physical therapy to come to his home to help him with his balance for now.  More than 50% of the visit was spent counseling and coordinating patient care.  All questions were answered to his satisfaction of the patient and the patient's ex-wife.    Return in about 6 months (around 04/16/2023).    Elliot Gault, DO     Portions of this note may be dictated using voice recognition software or a dictation service. Variances in spelling and vocabulary are possible and unintentional. Not all errors are caught/corrected. Please notify the [facility] if any discrepancies are noted or if the meaning of any statement is not clear.

## 2022-10-15 ENCOUNTER — Telehealth (INDEPENDENT_AMBULATORY_CARE_PROVIDER_SITE_OTHER): Payer: Self-pay | Admitting: Family Medicine

## 2022-10-15 DIAGNOSIS — M40203 Unspecified kyphosis, cervicothoracic region: Secondary | ICD-10-CM

## 2022-10-15 DIAGNOSIS — F331 Major depressive disorder, recurrent, moderate: Secondary | ICD-10-CM

## 2022-10-15 DIAGNOSIS — G319 Degenerative disease of nervous system, unspecified: Secondary | ICD-10-CM

## 2022-10-15 DIAGNOSIS — Z981 Arthrodesis status: Secondary | ICD-10-CM

## 2022-10-15 DIAGNOSIS — R29818 Other symptoms and signs involving the nervous system: Secondary | ICD-10-CM

## 2022-10-15 NOTE — Telephone Encounter (Signed)
[facility] called - stated pts caregiver Petros Raia had called her regarding getting pt admitted to hospice - they will evaluate & proceed if eligible.

## 2022-10-31 ENCOUNTER — Other Ambulatory Visit (INDEPENDENT_AMBULATORY_CARE_PROVIDER_SITE_OTHER): Payer: Self-pay | Admitting: Family Medicine

## 2022-11-10 ENCOUNTER — Other Ambulatory Visit (INDEPENDENT_AMBULATORY_CARE_PROVIDER_SITE_OTHER): Payer: Self-pay | Admitting: Family Medicine

## 2022-11-18 ENCOUNTER — Ambulatory Visit (INDEPENDENT_AMBULATORY_CARE_PROVIDER_SITE_OTHER): Payer: Self-pay | Admitting: Family Medicine

## 2022-12-04 ENCOUNTER — Encounter (INDEPENDENT_AMBULATORY_CARE_PROVIDER_SITE_OTHER): Admitting: Family Medicine

## 2022-12-04 DIAGNOSIS — M4802 Spinal stenosis, cervical region: Secondary | ICD-10-CM

## 2022-12-04 DIAGNOSIS — I10 Essential (primary) hypertension: Secondary | ICD-10-CM

## 2022-12-04 DIAGNOSIS — F319 Bipolar disorder, unspecified: Secondary | ICD-10-CM

## 2022-12-04 DIAGNOSIS — E538 Deficiency of other specified B group vitamins: Secondary | ICD-10-CM

## 2022-12-04 DIAGNOSIS — N401 Enlarged prostate with lower urinary tract symptoms: Secondary | ICD-10-CM

## 2022-12-04 DIAGNOSIS — R351 Nocturia: Secondary | ICD-10-CM

## 2022-12-04 DIAGNOSIS — I493 Ventricular premature depolarization: Secondary | ICD-10-CM

## 2022-12-04 DIAGNOSIS — G319 Degenerative disease of nervous system, unspecified: Secondary | ICD-10-CM

## 2022-12-04 DIAGNOSIS — G20A1 Parkinson's disease without dyskinesia, without mention of fluctuations (CMS HCC): Secondary | ICD-10-CM

## 2022-12-04 NOTE — Nursing Note (Signed)
[facility]  10/21/22 - 12/19/22  Plan of care reviewed & signed

## 2022-12-26 ENCOUNTER — Other Ambulatory Visit (INDEPENDENT_AMBULATORY_CARE_PROVIDER_SITE_OTHER): Payer: Self-pay | Admitting: Family Medicine

## 2023-01-01 ENCOUNTER — Encounter (INDEPENDENT_AMBULATORY_CARE_PROVIDER_SITE_OTHER): Payer: Self-pay | Admitting: Family Medicine

## 2023-04-23 ENCOUNTER — Ambulatory Visit (INDEPENDENT_AMBULATORY_CARE_PROVIDER_SITE_OTHER): Payer: Self-pay | Admitting: NEUROLOGY

## 2023-05-11 ENCOUNTER — Other Ambulatory Visit (INDEPENDENT_AMBULATORY_CARE_PROVIDER_SITE_OTHER): Payer: Self-pay | Admitting: Family Medicine

## 2023-05-18 ENCOUNTER — Other Ambulatory Visit (INDEPENDENT_AMBULATORY_CARE_PROVIDER_SITE_OTHER): Payer: Self-pay | Admitting: Family Medicine

## 2023-05-18 DIAGNOSIS — Z8659 Personal history of other mental and behavioral disorders: Secondary | ICD-10-CM

## 2023-05-18 MED ORDER — PAROXETINE 20 MG TABLET
20.0000 mg | ORAL_TABLET | Freq: Every day | ORAL | 1 refills | Status: AC
Start: 2023-05-18 — End: ?

## 2023-05-18 NOTE — Telephone Encounter (Signed)
Last patient visit was on 10/14/2022.    No return appointment scheduled.

## 2023-05-18 NOTE — Telephone Encounter (Signed)
RX approved and encounter closed

## 2023-06-04 ENCOUNTER — Ambulatory Visit (INDEPENDENT_AMBULATORY_CARE_PROVIDER_SITE_OTHER): Payer: Medicare Other

## 2023-06-04 ENCOUNTER — Other Ambulatory Visit: Payer: Self-pay

## 2023-06-04 DIAGNOSIS — Z23 Encounter for immunization: Secondary | ICD-10-CM

## 2023-06-16 ENCOUNTER — Other Ambulatory Visit (INDEPENDENT_AMBULATORY_CARE_PROVIDER_SITE_OTHER): Payer: Self-pay | Admitting: Family Medicine

## 2023-08-29 ENCOUNTER — Other Ambulatory Visit: Payer: Self-pay

## 2023-08-29 ENCOUNTER — Encounter (HOSPITAL_COMMUNITY): Payer: Self-pay

## 2023-08-29 ENCOUNTER — Emergency Department (HOSPITAL_COMMUNITY): Payer: Medicare Other

## 2023-08-29 ENCOUNTER — Emergency Department
Admission: EM | Admit: 2023-08-29 | Discharge: 2023-08-31 | Disposition: A | Discharge status: Skilled Nursing Facility | Payer: Medicare Other

## 2023-08-29 DIAGNOSIS — G20A1 Parkinson's disease without dyskinesia, without mention of fluctuations: Secondary | ICD-10-CM | POA: Insufficient documentation

## 2023-08-29 DIAGNOSIS — I6522 Occlusion and stenosis of left carotid artery: Secondary | ICD-10-CM | POA: Insufficient documentation

## 2023-08-29 DIAGNOSIS — Z87891 Personal history of nicotine dependence: Secondary | ICD-10-CM | POA: Insufficient documentation

## 2023-08-29 DIAGNOSIS — I4510 Unspecified right bundle-branch block: Secondary | ICD-10-CM | POA: Insufficient documentation

## 2023-08-29 DIAGNOSIS — J323 Chronic sphenoidal sinusitis: Secondary | ICD-10-CM | POA: Insufficient documentation

## 2023-08-29 DIAGNOSIS — R5381 Other malaise: Secondary | ICD-10-CM | POA: Insufficient documentation

## 2023-08-29 DIAGNOSIS — Z1152 Encounter for screening for COVID-19: Secondary | ICD-10-CM | POA: Insufficient documentation

## 2023-08-29 DIAGNOSIS — Z022 Encounter for examination for admission to residential institution: Secondary | ICD-10-CM | POA: Insufficient documentation

## 2023-08-29 LAB — URINALYSIS, MACROSCOPIC
BILIRUBIN: NEGATIVE mg/dL
BLOOD: NEGATIVE mg/dL
GLUCOSE: NEGATIVE mg/dL
KETONES: NEGATIVE mg/dL
LEUKOCYTES: NEGATIVE WBCs/uL
NITRITE: NEGATIVE
PH: 7 (ref 5.0–9.0)
PROTEIN: NEGATIVE mg/dL
SPECIFIC GRAVITY: 1.006 (ref 1.002–1.030)
UROBILINOGEN: NORMAL mg/dL

## 2023-08-29 LAB — COMPREHENSIVE METABOLIC PANEL, NON-FASTING
ALBUMIN/GLOBULIN RATIO: 1.4 (ref 0.8–1.4)
ALBUMIN: 3.9 g/dL (ref 3.5–5.7)
ALKALINE PHOSPHATASE: 109 U/L — ABNORMAL HIGH (ref 34–104)
ALT (SGPT): 22 U/L (ref 7–52)
ANION GAP: 6 mmol/L (ref 4–13)
AST (SGOT): 23 U/L (ref 13–39)
BILIRUBIN TOTAL: 0.5 mg/dL (ref 0.3–1.0)
BUN/CREA RATIO: 19 (ref 6–22)
BUN: 15 mg/dL (ref 7–25)
CALCIUM, CORRECTED: 9.3 mg/dL (ref 8.9–10.8)
CALCIUM: 9.2 mg/dL (ref 8.6–10.3)
CHLORIDE: 106 mmol/L (ref 98–107)
CO2 TOTAL: 29 mmol/L (ref 21–31)
CREATININE: 0.78 mg/dL (ref 0.60–1.30)
ESTIMATED GFR: 87 mL/min/{1.73_m2} (ref >59)
GLOBULIN: 2.7 (ref 2.0–3.5)
GLUCOSE: 135 mg/dL — ABNORMAL HIGH (ref 74–109)
OSMOLALITY, CALCULATED: 284 mosm/kg (ref 270–290)
POTASSIUM: 4.1 mmol/L (ref 3.5–5.1)
PROTEIN TOTAL: 6.6 g/dL (ref 6.4–8.9)
SODIUM: 141 mmol/L (ref 136–145)

## 2023-08-29 LAB — CBC WITH DIFF
BASOPHIL #: 0 10*3/uL (ref 0.00–0.10)
BASOPHIL %: 1 % (ref 0–1)
EOSINOPHIL #: 0.1 10*3/uL (ref 0.00–0.50)
EOSINOPHIL %: 2 % (ref 1–8)
HCT: 45.2 % (ref 36.7–47.1)
HGB: 15.1 g/dL (ref 12.5–16.3)
LYMPHOCYTE #: 0.9 10*3/uL — ABNORMAL LOW (ref 1.00–3.00)
LYMPHOCYTE %: 19 % (ref 16–44)
MCH: 29.4 pg (ref 23.8–33.4)
MCHC: 33.4 g/dL (ref 32.5–36.3)
MCV: 87.9 fL (ref 73.0–96.2)
MONOCYTE #: 0.4 10*3/uL (ref 0.30–1.00)
MONOCYTE %: 8 % (ref 5–13)
MPV: 7.7 fL (ref 7.4–11.4)
NEUTROPHIL #: 3.4 10*3/uL (ref 1.85–7.80)
NEUTROPHIL %: 70 % (ref 43–77)
PLATELETS: 213 10*3/uL (ref 140–440)
RBC: 5.14 10*6/uL (ref 4.06–5.63)
RDW: 15.4 % (ref 12.1–16.2)
WBC: 4.9 10*3/uL (ref 3.6–10.2)

## 2023-08-29 LAB — URINALYSIS, MICROSCOPIC: WBCS: 1 /[HPF] (ref <6)

## 2023-08-29 LAB — LIPASE: LIPASE: 18 U/L (ref 11–82)

## 2023-08-29 LAB — GRAY TOP TUBE

## 2023-08-29 LAB — TROPONIN-I
TROPONIN I: 7 ng/L (ref <20)
TROPONIN I: 8 ng/L (ref <20)

## 2023-08-29 LAB — MAGNESIUM: MAGNESIUM: 1.9 mg/dL (ref 1.9–2.7)

## 2023-08-29 LAB — LACTIC ACID LEVEL W/ REFLEX FOR LEVEL >2.0: LACTIC ACID: 1.2 mmol/L (ref 0.5–2.2)

## 2023-08-29 MED ORDER — CARBIDOPA 25 MG-LEVODOPA 100 MG TABLET
1.0000 | ORAL_TABLET | Freq: Two times a day (BID) | ORAL | Status: DC
Start: 2023-08-30 — End: 2023-08-31
  Administered 2023-08-30 – 2023-08-31 (×3): 1 via ORAL
  Filled 2023-08-29 (×8): qty 1

## 2023-08-29 MED ORDER — TAMSULOSIN 0.4 MG CAPSULE
0.4000 mg | ORAL_CAPSULE | Freq: Two times a day (BID) | ORAL | Status: DC
Start: 2023-08-30 — End: 2023-08-31
  Administered 2023-08-30 – 2023-08-31 (×3): 0.4 mg via ORAL

## 2023-08-29 MED ORDER — FLUOCINOLONE ACETONIDE OIL 0.01 % EAR DROPS
4.0000 [drp] | Freq: Two times a day (BID) | OTIC | Status: DC
Start: 2023-08-29 — End: 2023-08-31
  Administered 2023-08-29 – 2023-08-31 (×4): 0 [drp] via OTIC

## 2023-08-29 MED ORDER — QUETIAPINE 25 MG TABLET
12.5000 mg | ORAL_TABLET | Freq: Every evening | ORAL | Status: DC
Start: 2023-08-29 — End: 2023-08-31
  Administered 2023-08-29 – 2023-08-30 (×2): 12.5 mg via ORAL

## 2023-08-29 MED ORDER — PAROXETINE 20 MG TABLET
20.0000 mg | ORAL_TABLET | Freq: Every day | ORAL | Status: DC
Start: 2023-08-29 — End: 2023-08-31
  Administered 2023-08-29 – 2023-08-31 (×3): 20 mg via ORAL
  Filled 2023-08-29 (×7): qty 1

## 2023-08-29 MED ORDER — IOHEXOL 350 MG IODINE/ML INTRAVENOUS SOLUTION
100.0000 mL | INTRAVENOUS | Status: AC
Start: 2023-08-29 — End: 2023-08-29
  Administered 2023-08-29: 100 mL via INTRAVENOUS

## 2023-08-29 MED ORDER — CHOLECALCIFEROL (VITAMIN D3) 25 MCG (1,000 UNIT) TABLET
ORAL_TABLET | ORAL | Status: AC
Start: 2023-08-29 — End: 2023-08-29
  Filled 2023-08-29: qty 1

## 2023-08-29 MED ORDER — CHOLECALCIFEROL (VITAMIN D3) 25 MCG (1,000 UNIT) TABLET
1000.0000 [IU] | ORAL_TABLET | Freq: Every day | ORAL | Status: DC
Start: 2023-08-29 — End: 2023-08-31
  Administered 2023-08-29 – 2023-08-31 (×3): 1000 [IU] via ORAL

## 2023-08-29 MED ORDER — TRIAMCINOLONE ACETONIDE 0.1 % TOPICAL CREAM
TOPICAL_CREAM | Freq: Two times a day (BID) | CUTANEOUS | Status: DC
Start: 2023-08-29 — End: 2023-08-31
  Administered 2023-08-29: 0 mL via TOPICAL
  Filled 2023-08-29 (×2): qty 15

## 2023-08-29 MED ORDER — LISINOPRIL 5 MG TABLET
ORAL_TABLET | ORAL | Status: AC
Start: 2023-08-29 — End: 2023-08-29
  Filled 2023-08-29: qty 2

## 2023-08-29 MED ORDER — LATANOPROST 0.005 % EYE DROPS
1.0000 [drp] | Freq: Every evening | OPHTHALMIC | Status: DC
Start: 2023-08-29 — End: 2023-08-31
  Administered 2023-08-29 – 2023-08-30 (×2): 0 [drp] via OPHTHALMIC
  Filled 2023-08-29: qty 2.5

## 2023-08-29 MED ORDER — LISINOPRIL 5 MG TABLET
10.0000 mg | ORAL_TABLET | Freq: Every day | ORAL | Status: DC
Start: 2023-08-29 — End: 2023-08-31
  Administered 2023-08-29: 10 mg via ORAL
  Administered 2023-08-30: 0 mg via ORAL
  Administered 2023-08-31: 10 mg via ORAL

## 2023-08-29 MED ORDER — QUETIAPINE 25 MG TABLET
ORAL_TABLET | ORAL | Status: AC
Start: 2023-08-29 — End: 2023-08-29
  Filled 2023-08-29: qty 1

## 2023-08-29 NOTE — ED Provider Notes (Signed)
Emergency Medicine      Name: Steven Lam  Age and Gender: 88 y.o. male  Date of Birth: July 29, 1936  MRN: [id number]  PCP: Mickey Farber, DO    CC:  Chief Complaint   Patient presents with    Weakness       HPI:  Steven Lam is a 88 y.o. White male who presents to the ER with worsening of Parkinson's symptoms.  Caregiver fatigue.  Wife is no longer able to care for patient at home.  With assistance patient is still at risk for injury due to progression of symptoms.  Patient currently in process for nursing home placement.  Family states the patient has been experiencing right-sided facial drooping with increased weakness and spasmodic jerking of muscles.    Below pertinent information reviewed with patient:  Past Medical History:   Diagnosis Date    Asymptomatic PVCs     B12 deficiency     Bipolar disorder, unspecified (CMS HCC)     BPH associated with nocturia     Cancer (CMS HCC)     Degenerative cervical spinal stenosis     Eczema of both external ears     Essential hypertension     Gross hematuria     Hearing loss     History of COVID-19     History of rectal fissure     Hydronephrosis     Impacted cerumen of both ears     Left rotator cuff tear     Lower extremity weakness     Mixed hyperlipidemia     Orthostatic hypotension     Parkinson's disease (CMS HCC)     Sleep apnea     Spinal curvature     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)     Upper extremity weakness     Vitamin D deficiency            Allergies   Allergen Reactions    Aspirin      Other reaction(s): Other (See Comments)  Per patient "stomach bleed"       Past Surgical History:   Procedure Laterality Date    HX BACK SURGERY      x3    HX CATARACT REMOVAL      HX CHOLECYSTECTOMY      HX HIP REPLACEMENT Left     HX TONSILLECTOMY      LAPAROSCOPIC CHOLECYSTECTOMY          Social History     Socioeconomic History    Marital status: Divorced   Tobacco Use    Smoking status: Former     Current packs/day: 0.00     Average packs/day: 1 pack/day for 10.0 years  (10.0 ttl pk-yrs)     Types: Cigarettes     Start date: 08/04/1958     Quit date: 08/04/1968     Years since quitting: 55.1    Smokeless tobacco: Never   Vaping Use    Vaping status: Never Used   Substance and Sexual Activity    Alcohol use: Never    Drug use: Never       ROS:  No other overt positive review of systems are noted other than stated in the HPI.      Objective:    ED Triage Vitals [08/29/23 1004]   BP (Non-Invasive) 128/86   Heart Rate 71   Respiratory Rate 18   Temperature (!) 35.9 C (96.7 F)   SpO2 93 %   Weight 77.1 kg (170 lb)  Height 1.702 m (5\' 7" )     Filed Vitals:    08/29/23 2015 08/29/23 2030 08/29/23 2045 08/29/23 2100   BP: 110/63 102/64 118/61 (!) 102/55   Pulse: 76 74 76 74   Resp: 12 12  (!) 11   Temp:       SpO2: 92% 93% 93% 93%       Nursing notes and vital signs reviewed.    Constitutional - No acute distress.  Alert and Active.  HEENT - Normocephalic. Atraumatic. PERRL. EOMI. Conjunctiva clear. TM's pearly grey, translucent, without bulging or retraction. Oropharynx with no erythema, lesions, or exudates. Moist mucous membranes.   Neck - Trachea midline. No stridor. No hoarseness.  Cardiac - Regular rate and rhythm. No murmurs, rubs, or gallops. Intact distal pulses.  Respiratory/Chest - Normal respiratory effort. Clear to auscultation bilaterally. No rales, wheezes or rhonchi. No chest tenderness.  Abdomen - Normal bowel sounds. Non-tender, soft, non-distended. No rebound or guarding.   Musculoskeletal - Spasmodic jerking noted.  Stiffened ROM. No muscle or joint tenderness appreciated. No clubbing, cyanosis or edema.  Skin - Warm and dry, without any rashes or other lesions.  Neuro - Alert and oriented x 3.   Psych - Normal mood and affect. Behavior is normal          Any pertinent labs and imaging obtained during this encounter reviewed below in MDM.    EKG:  Device interpretation:  Sinus rhythm with premature atrial complexes; left axis deviation; right BBB; abnormal  ECG  Ventricular rate 72 bpm  PR interval 180 ms  QRS duration 122 ms  QT/QTC 408/446 ms.     MDM/ED Course:        Medical Decision Making  [name] is a 88 y.o. White male who presents to the ER with worsening of Parkinson's symptoms.  Caregiver fatigue.  Wife is no longer able to care for patient at home.  With assistance patient is still at risk for injury due to progression of symptoms.  Patient currently in process for nursing home placement.  Family states the patient has been experiencing right-sided facial drooping with increased weakness and spasmodic jerking of muscles.    Patient is alert without complaints voiced.  States he "is fine except for this jerking".  Wife at bedside tearful reports she is a longer able to care for patient home even with assistance.  States his increased symptoms of Parkinson's such as the spasmodic jerking, profound weakness and decreased mobility has caused it to be increasingly difficult to care for him at home.  She fears that he is going to suffer a catastrophic injury due to fall.    Wife states that he has experienced increased facial drooping, weakness and slurred speech over the last couple of days.    Diagnostics will include labs, EKG and radiology.    Refer to ED course notes for continued documentation.    Amount and/or Complexity of Data Reviewed  Labs: ordered.  Radiology: ordered. Decision-making details documented in ED Course.  ECG/medicine tests: ordered and independent interpretation performed.            ED Course as of 08/29/23 2156   Sat Aug 29, 2023   1104 CT BRAIN WO IV CONTRAST  FINDINGS:  There is no acute intracranial hemorrhage, mass effect, or evidence of large acute infarct.     Brain: Low density in the periventricular white matter suggests mild chronic small vessel ischemic changes.     CSF  Spaces: Mild generalized cerebral atrophy      Sinuses/Mastoids:  Opacification of the left sphenoid sinus.      Bones: Unremarkable         IMPRESSION:  CHRONIC CHANGES. NO ACUTE INTRACRANIAL FINDINGS.  SPHENOID SINUS DISEASE.     1104 LYMPHS ABS(!): 0.90   1131 GLUCOSE(!): 135   1131 ALKALINE PHOSPHATASE(!): 109   1131 MAGNESIUM  Within normal   1131 LIPASE  Within normal   1131 LACTIC ACID LEVEL W/ REFLEX FOR LEVEL >2.0  Within normal   1131 TROPONIN NOW  Within normal   1301 TROPONIN-I: 8   1301 URINALYSIS, MACROSCOPIC AND MICROSCOPIC W/CULTURE REFLEX [PRN ONLY]  Within normal   1302 Will discuss admission with Hospitalist via secure epic chat.   1336 Awaiting admission orders.   1512 Dr. Linde Gillis in to see pt.  Unfortunately pt does not meet admission criteria.     1525 Contacting hospice services provider requesting assistance with reevaluation of pt for continuous care.  Awaiting return call.   1534 [facility] return call; will be in to evaluate pt ASAP.   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  6.IF THERE IS CLINICAL CONCERN FOR ACUTE STROKE, RECOMMEND MRI OF THE BRAIN        1716 [facility] present for evaluation.   1723 Patient to be ER hold for nursing home placement.   1737 Continued home medications during ER hold.   1738 Called wife, Philemon Kingdom at [phone] to update on pt status of ER HOLD.  Wife provided alternative telephone [phone] if need.  Ms. Sharrok advises she will be back in the morning, please call her if anything is needed or changes.   2152 Report given to D. Felizardo Hoffmann, FNP-BC; care relinquished.       Orders Placed This Encounter    CT BRAIN WO IV CONTRAST    CT ANGIO INTRA-EXTRA CRANIAL W IV CONTRAST    CBC/DIFF    COMPREHENSIVE METABOLIC PANEL, NON-FASTING    LACTIC ACID LEVEL W/ REFLEX FOR LEVEL >2.0     MAGNESIUM    TROPONIN NOW    TROPONIN IN ONE HOUR    CANCELED: TROPONIN IN THREE HOURS    URINALYSIS, MACROSCOPIC AND MICROSCOPIC W/CULTURE REFLEX [PRN ONLY]    LIPASE    CBC WITH DIFF    URINALYSIS, MACROSCOPIC    URINALYSIS, MICROSCOPIC    EXTRA TUBES    GRAY TOP TUBE    CANCELED: TROPONIN IN THREE HOURS    CANCELED: OT EVAL & TREAT    OT EVAL & TREAT    CANCELED: PT EVALUATE AND TREAT    PT EVALUATE AND TREAT    PULSE OXIMETRY - CONTINUOUS    ECG 12 LEAD    iohexol (OMNIPAQUE 350) infusion    lisinopril (PRINIVIL) tablet    PARoxetine (PAXIL) tablet    QUEtiapine (SEROquel) tablet    carbidopa-levodopa (SINEMET) 25-100 mg per tablet    latanoprost (XALATAN) 0.005 % ophthalmic solution    tamsulosin (FLOMAX) capsule    triamcinolone acetonide 0.1% topical cream    fluocinolone acetonide  (DERMOTIC OIL) 0.01% otic    cholecalciferol (VITAMIN D3) 1000 unit (25 mcg) tablet  Any procedures:  Procedures    Impression:   Clinical Impression   General physical deterioration (Primary)   Parkinson's disease (tremor, stiffness, slow motion, unstable posture) (CMS HCC)       Disposition: Data Unavailable    / Corinna Lines, FNP-BC  08/29/2023, 10:13  [facility]  Department of Emergency Medicine  [facility]    Portions of this note may have been dictated using voice recognition software.     -----------------------  Results for orders placed or performed during the hospital encounter of 08/29/23 (from the past 12 hour(s))   COMPREHENSIVE METABOLIC PANEL, NON-FASTING   Result Value Ref Range    SODIUM 141 136 - 145 mmol/L    POTASSIUM 4.1 3.5 - 5.1 mmol/L    CHLORIDE 106 98 - 107 mmol/L    CO2 TOTAL 29 21 - 31 mmol/L    ANION GAP 6 4 - 13 mmol/L    BUN 15 7 - 25 mg/dL    CREATININE 1.61 0.96 - 1.30 mg/dL    BUN/CREA RATIO 19 6 - 22    ESTIMATED GFR 87 >59 mL/min/1.73m^2    ALBUMIN 3.9 3.5 - 5.7 g/dL    CALCIUM 9.2 8.6 - 04.5 mg/dL    GLUCOSE 409 (H) 74 - 109 mg/dL    ALKALINE PHOSPHATASE  109 (H) 34 - 104 U/L    ALT (SGPT) 22 7 - 52 U/L    AST (SGOT) 23 13 - 39 U/L    BILIRUBIN TOTAL 0.5 0.3 - 1.0 mg/dL    PROTEIN TOTAL 6.6 6.4 - 8.9 g/dL    ALBUMIN/GLOBULIN RATIO 1.4 0.8 - 1.4    OSMOLALITY, CALCULATED 284 270 - 290 mOsm/kg    CALCIUM, CORRECTED 9.3 8.9 - 10.8 mg/dL    GLOBULIN 2.7 2.0 - 3.5   LACTIC ACID LEVEL W/ REFLEX FOR LEVEL >2.0   Result Value Ref Range    LACTIC ACID 1.2 0.5 - 2.2 mmol/L   MAGNESIUM   Result Value Ref Range    MAGNESIUM 1.9 1.9 - 2.7 mg/dL   TROPONIN NOW   Result Value Ref Range    TROPONIN I 7 <20 ng/L   LIPASE   Result Value Ref Range    LIPASE 18 11 - 82 U/L   CBC WITH DIFF   Result Value Ref Range    WBC 4.9 3.6 - 10.2 x10^3/uL    RBC 5.14 4.06 - 5.63 x10^6/uL    HGB 15.1 12.5 - 16.3 g/dL    HCT 81.1 91.4 - 78.2 %    MCV 87.9 73.0 - 96.2 fL    MCH 29.4 23.8 - 33.4 pg    MCHC 33.4 32.5 - 36.3 g/dL    RDW 95.6 21.3 - 08.6 %    PLATELETS 213 140 - 440 x10^3/uL    MPV 7.7 7.4 - 11.4 fL    NEUTROPHIL % 70 43 - 77 %    LYMPHOCYTE % 19 16 - 44 %    MONOCYTE % 8 5 - 13 %    EOSINOPHIL % 2 1 - 8 %    BASOPHIL % 1 0 - 1 %    NEUTROPHIL # 3.40 1.85 - 7.80 x10^3/uL    LYMPHOCYTE # 0.90 (L) 1.00 - 3.00 x10^3/uL    MONOCYTE # 0.40 0.30 - 1.00 x10^3/uL    EOSINOPHIL # 0.10 0.00 - 0.50 x10^3/uL    BASOPHIL # 0.00 0.00 - 0.10 x10^3/uL   URINALYSIS, MACROSCOPIC   Result  Value Ref Range    COLOR Yellow Colorless, Light Yellow, Yellow    APPEARANCE Clear Clear    SPECIFIC GRAVITY 1.006 1.002 - 1.030    PH 7.0 5.0 - 9.0    LEUKOCYTES Negative Negative, 100  WBCs/uL    NITRITE Negative Negative    PROTEIN Negative Negative, 10 , 20  mg/dL    GLUCOSE Negative Negative, 30  mg/dL    KETONES Negative Negative, Trace mg/dL    BILIRUBIN Negative Negative, 0.5 mg/dL    BLOOD Negative Negative, 0.03 mg/dL    UROBILINOGEN Normal Normal mg/dL   URINALYSIS, MICROSCOPIC   Result Value Ref Range    RBCS      WBCS 1 <6 /hpf   TROPONIN IN ONE HOUR   Result Value Ref Range    TROPONIN I 8 <20 ng/L   GRAY  TOP TUBE   Result Value Ref Range    RAINBOW/EXTRA TUBE AUTO RESULT Yes      CT ANGIO INTRA-EXTRA CRANIAL W IV CONTRAST   Final Result   1. RIGHT CAROTID: UNREMARKABLE   2. 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 VERTEBRALS: PARTIALLY SECURED BY ADJACENT SURGICAL HARDWARE BUT NO DEFINITE ACUTE FINDINGS   4. INTRACRANIAL: NO ACUTE FINDINGS   5. LEFT SPHENOID SINUSITIS.   6. IF THERE IS CLINICAL CONCERN FOR ACUTE STROKE, RECOMMEND MRI OF THE BRAIN         One or more dose reduction techniques were used (e.g., Automated exposure control, adjustment of the mA and/or kV according to patient size, use of iterative reconstruction technique).         Radiologist location ID: [id number]         CT BRAIN WO IV CONTRAST   Final Result   CHRONIC CHANGES. NO ACUTE INTRACRANIAL FINDINGS.   SPHENOID SINUS DISEASE.            One or more dose reduction techniques were used (e.g., Automated exposure control, adjustment of the mA and/or kV according to patient size, use of iterative reconstruction technique).         Radiologist location ID: [id number]

## 2023-08-29 NOTE — ED Triage Notes (Signed)
Worsening weakness, right side facial droop, slurred speech since Monday. Pt on hospice for Parkinson's - family wants rehab for increased weakness    BWVRS: 20g left ac, monitor

## 2023-08-29 NOTE — ED Nurses Note (Signed)
MPOA Kiaan Overholser [phone]) [phone]) requested she is called for updates because she has to leave ER

## 2023-08-29 NOTE — ED Nurses Note (Signed)
Spoke to hospice regarding pt status within the ER

## 2023-08-29 NOTE — ED Nurses Note (Signed)
Pt medical power of attorney reports pt is a DNR but does not have paperwork with her. Family reports pt has had facial drooping, slurred speech and dysphagia. Symptoms started Monday. Family reports pts symptoms appear slightly better today. Family reports pt started levodopa-carbidopa on Monday for Parkinson's since then his Parkinson's symptoms such as shaking have worsened.

## 2023-08-29 NOTE — ED APP Handoff Note (Signed)
[facility] - Emergency Department  Emergency Department  Course Note    Care/report received from Corinna Lines, NP at  22:05.  Per report:  Celia Gibbons is a 88 y.o. male who had concerns including Weakness.     Pending labs/imaging/consults:  Case management consultation  Plan:  Case management consultation    Course:  11:06 p.m..  Assumed care of patient.  Lying in bed resting quietly in no acute distress.  Wakes easily to voice.  Voices no physical complaints.  Is alert and in no acute distress while awake.  Airway is patent.  Breath sounds are clear and equal bilaterally.  Skin is warm and dry.  Abdomen is soft.  Moves all extremities.  We will continue to monitor while awaiting consultation from case management.  ED Course as of 08/31/23 0610   Mon Aug 31, 2023   0513 Nursing staff reports that the patient's O2 saturation dropped into the upper 80s.  The place the patient on oxygen at 2 liters/minute which raised his O2 saturation in the 97%.   0609 COVID-19 screen negative.  Influenza screen negative.  RSV screen negative.     Disposition pending at the time of sign out. Care of Estiven Kohan was signed out to Thereasa Solo, NP at 6:00 a.m. following a discussion of the patient's course. Please refer to their Course Note for further details of the patient's ED course.    Clinical Impression   General physical deterioration (Primary)   Parkinson's disease (tremor, stiffness, slow motion, unstable posture) (CMS HCC)         Annamaria Helling, FNP-BC

## 2023-08-30 MED ORDER — CHOLECALCIFEROL (VITAMIN D3) 25 MCG (1,000 UNIT) TABLET
ORAL_TABLET | ORAL | Status: AC
Start: 2023-08-30 — End: 2023-08-30
  Filled 2023-08-30: qty 1

## 2023-08-30 MED ORDER — TAMSULOSIN 0.4 MG CAPSULE
ORAL_CAPSULE | ORAL | Status: AC
Start: 2023-08-30 — End: 2023-08-30
  Filled 2023-08-30: qty 1

## 2023-08-30 MED ORDER — QUETIAPINE 25 MG TABLET
ORAL_TABLET | ORAL | Status: AC
Start: 2023-08-30 — End: 2023-08-30
  Filled 2023-08-30: qty 1

## 2023-08-30 NOTE — ED APP Handoff Note (Signed)
[facility]  Emergency Department  Course Note    Patient Name: Steven Lam  Age and Gender: 88 y.o. male  Date of Birth: [birth year]  Date of Service: 08/29/2023  MRN: [id number]  PCP: Mickey Farber, DO    After a thorough discussion of the patient I have assumed care of Steven Lam from Steven Awe, FNP at 06:00. Patient resting quietly in bed. No acute distress noted. Family at bedside and aware still waiting placement.     VS:  Temperature: (!) 35.9 C (96.7 F)  Heart Rate: 77  Respiratory Rate: 14  BP (Non-Invasive): 108/77  SpO2: 93 %    DIAGNOSTICS  Labs Ordered/Reviewed   COMPREHENSIVE METABOLIC PANEL, NON-FASTING - Abnormal; Notable for the following components:       Result Value    GLUCOSE 135 (*)     ALKALINE PHOSPHATASE 109 (*)     All other components within normal limits    Narrative:     Estimated Glomerular Filtration Rate (eGFR) is calculated using the CKD-EPI (2021) equation, intended for patients 22 years of age and older. If gender is not documented or "unknown", there will be no eGFR calculation.     CBC WITH DIFF - Abnormal; Notable for the following components:    LYMPHOCYTE # 0.90 (*)     All other components within normal limits   LACTIC ACID LEVEL W/ REFLEX FOR LEVEL >2.0 - Normal   MAGNESIUM - Normal   TROPONIN-I - Normal   TROPONIN-I - Normal   LIPASE - Normal   URINALYSIS, MACROSCOPIC - Normal   CBC/DIFF    Narrative:     The following orders were created for panel order CBC/DIFF.  Procedure                               Abnormality         Status                     ---------                               -----------         ------                     CBC WITH JYNW[[id number]]                Abnormal            Final result                 Please view results for these tests on the individual orders.   URINALYSIS, MACROSCOPIC AND MICROSCOPIC W/CULTURE REFLEX    Narrative:     The following orders were created for panel order URINALYSIS, MACROSCOPIC AND  MICROSCOPIC W/CULTURE REFLEX [PRN ONLY].  Procedure                               Abnormality         Status                     ---------                               -----------         ------  URINALYSIS, MACROSCOPIC[[id number]]      Normal              Final result               URINALYSIS, MICROSCOPIC[[id number]]                          Final result                 Please view results for these tests on the individual orders.   URINALYSIS, MICROSCOPIC   EXTRA TUBES    Narrative:     The following orders were created for panel order EXTRA TUBES.  Procedure                               Abnormality         Status                     ---------                               -----------         ------                     Carolynne Edouard AVWU[[id number]]                                    Final result                 Please view results for these tests on the individual orders.   GRAY TOP TUBE     CT ANGIO INTRA-EXTRA CRANIAL W IV CONTRAST   Final Result   1. RIGHT CAROTID: UNREMARKABLE   2. 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 VERTEBRALS: PARTIALLY SECURED BY ADJACENT SURGICAL HARDWARE BUT NO DEFINITE ACUTE FINDINGS   4. INTRACRANIAL: NO ACUTE FINDINGS   5. LEFT SPHENOID SINUSITIS.   6. IF THERE IS CLINICAL CONCERN FOR ACUTE STROKE, RECOMMEND MRI OF THE BRAIN         One or more dose reduction techniques were used (e.g., Automated exposure control, adjustment of the mA and/or kV according to patient size, use of iterative reconstruction technique).         Radiologist location ID: [id number]         CT BRAIN WO IV CONTRAST   Final Result   CHRONIC CHANGES. NO ACUTE INTRACRANIAL FINDINGS.   SPHENOID SINUS DISEASE.            One or more dose reduction techniques were used (e.g., Automated exposure control, adjustment of the mA and/or kV according to patient size, use of iterative reconstruction technique).         Radiologist location ID: [id number]             PENDING  STUDIES AT TIME OF TRANSITION  None    ED COURSE/MEDICAL DECISION MAKING  Medications Administered in the ED          Medical Decision Making  Patient is still waiting placement, however discussion was had with the patient and his family concerning living well advanced directives and medical power of attorney.  The patient and his family mutually agreed that if his heart  was to stop he does not want to be resuscitated at this time.  Following this conversation DNR order was signed by the parties involved.    Amount and/or Complexity of Data Reviewed  Labs: ordered.  Radiology: ordered.  ECG/medicine tests: ordered.    Risk  OTC drugs.  Prescription drug management.      Consults  Case Management    CLINICAL IMPRESSION  Clinical Impression   General physical deterioration (Primary)   Parkinson's disease (tremor, stiffness, slow motion, unstable posture) (CMS HCC)     DISPOSITION  Data Unavailable       DISCHARGE MEDICATIONS  Current Discharge Medication List          Christin Fudge, FNP   08/30/2023, 10:26   [facility]  Department of Emergency Medicine  [facility]    This note was partially generated using MModal Fluency Direct system, and there may be some incorrect words, spellings, and punctuation that were not noted in checking the note before saving.

## 2023-08-30 NOTE — ED APP Handoff Note (Signed)
[facility] - Emergency Department  Emergency Department  Course Note    Care/report received from Christin Fudge, NP  at  18:03.  Per report:  Steven Lam is a 88 y.o. male who had concerns including Weakness.     Pending labs/imaging/consults:  Awaiting case management evaluation for nursing home placement  Plan:  Pending case management evaluation for nursing home placement.    Course:  6:44 p.m..  Assumed care of patient.  Lying in bed alert and in no acute distress.  States that he feels okay.  Airway is patent.  Breath sounds are clear and equal bilaterally.  Skin is warm and dry.  Abdomen is soft.  Moves all extremities.  We will continue to monitor while awaiting case management evaluation for nursing home placement.  ED Course as of 08/31/23 0611   Mon Aug 31, 2023   0513 Nursing staff reports that the patient's O2 saturation dropped into the upper 80s.  The place the patient on oxygen at 2 liters/minute which raised his O2 saturation in the 97%.   0609 COVID-19 screen negative.  Influenza screen negative.  RSV screen negative.     Disposition pending at the time of sign out. Care of Steven Lam was signed out to [employer], [place] at 6:00 a.m. following a discussion of the patient's course. Please refer to their Course Note for further details of the patient's ED course.    Clinical Impression   General physical deterioration (Primary)   Parkinson's disease (tremor, stiffness, slow motion, unstable posture) (CMS HCC)         Annamaria Helling, FNP-BC

## 2023-08-30 NOTE — ED Nurses Note (Signed)
Patient's brief changed and new brief provided. Patient repositioned in bed. Patient on monitor, call bell in reach. Patient denies any needs at this time.

## 2023-08-30 NOTE — ED Nurses Note (Signed)
PATIENTS BRIEF CHANGED AND PATIENT PLACED ON HOSPITAL BED. PATIENT TOLERATED BEING MOVED WELL.

## 2023-08-30 NOTE — Care Management Notes (Signed)
Referral info submitted via [employer]. CM has spoken to both Cephas Darby with [facility] and Derek Jack with [facility] due to pt being "on list for bed" for both [facility] and [facility]. Pt's ex-wife/caregiver states that Medicaid has been approved and that both facilities have the information. CM will verify with Apolinar Junes [facility]) on 08/31/23. PAS completed and will submit when site is available.

## 2023-08-31 ENCOUNTER — Emergency Department (HOSPITAL_COMMUNITY): Payer: Medicare Other

## 2023-08-31 DIAGNOSIS — I4510 Unspecified right bundle-branch block: Secondary | ICD-10-CM

## 2023-08-31 DIAGNOSIS — I491 Atrial premature depolarization: Secondary | ICD-10-CM

## 2023-08-31 DIAGNOSIS — R9431 Abnormal electrocardiogram [ECG] [EKG]: Secondary | ICD-10-CM

## 2023-08-31 LAB — ECG 12 LEAD
Atrial Rate: 72 {beats}/min
Calculated P Axis: 56 degrees
Calculated R Axis: -39 degrees
Calculated T Axis: 24 degrees
PR Interval: 180 ms
QRS Duration: 122 ms
QT Interval: 408 ms
QTC Calculation: 446 ms
Ventricular rate: 72 {beats}/min

## 2023-08-31 LAB — ARTERIAL BLOOD GAS/LACTATE
%FIO2 (ARTERIAL): 24 %
ALLEN TEST: POSITIVE
BASE EXCESS (ARTERIAL): 4.5 mmol/L — ABNORMAL HIGH (ref 0.0–2.0)
BICARBONATE (ARTERIAL): 27.7 mmol/L — ABNORMAL HIGH (ref 20.0–26.0)
CARBOXYHEMOGLOBIN: 0.9 % (ref <=1.5)
LACTATE: 0.5 mmol/L (ref <=2.0)
MET-HEMOGLOBIN: 0.4 % (ref <=2.0)
O2CT: 18.2 %
OXYHEMOGLOBIN: 88.9 % (ref 88.0–100.0)
PCO2 (ARTERIAL): 43 mm[Hg] (ref 35–45)
PH (ARTERIAL): 7.43 (ref 7.35–7.45)
PO2 (ARTERIAL): 56 mm[Hg] — ABNORMAL LOW (ref 80–100)

## 2023-08-31 LAB — COVID-19, FLU A/B, RSV RAPID BY PCR
INFLUENZA VIRUS TYPE A: NOT DETECTED
INFLUENZA VIRUS TYPE B: NOT DETECTED
RESPIRATORY SYNCTIAL VIRUS (RSV): NOT DETECTED
SARS-CoV-2: NOT DETECTED

## 2023-08-31 MED ORDER — LISINOPRIL 5 MG TABLET
ORAL_TABLET | ORAL | Status: AC
Start: 2023-08-31 — End: 2023-08-31
  Filled 2023-08-31: qty 2

## 2023-08-31 MED ORDER — CHOLECALCIFEROL (VITAMIN D3) 25 MCG (1,000 UNIT) TABLET
ORAL_TABLET | ORAL | Status: AC
Start: 2023-08-31 — End: 2023-08-31
  Filled 2023-08-31: qty 1

## 2023-08-31 MED ORDER — TAMSULOSIN 0.4 MG CAPSULE
ORAL_CAPSULE | ORAL | Status: AC
Start: 2023-08-31 — End: 2023-08-31
  Filled 2023-08-31: qty 1

## 2023-08-31 NOTE — ED Nurses Note (Signed)
Patient repositioned and turned in hospital bed. Patient denies any needs at this time. Canister emptied of urine. Patient on monitor and call bell in reach.

## 2023-08-31 NOTE — Care Management Notes (Signed)
Case Details  CASE ID CASE CONTRACT SUBMITTED ON COMPLETED ON REASON OUTCOME   [id number] [facility] PAS 08/31/2023 8:49:05 AM 08/31/2023 11:46:35 AM L1 Level 1 Approved - Level 2 Not Required

## 2023-08-31 NOTE — ED Nurses Note (Signed)
Family at bedside updated to plan of care. EMS at bedside for transport.

## 2023-08-31 NOTE — PT Evaluation (Signed)
[facility] Medicine [facility]  [address]  [place], [address]  [phone]  (Fax) [phone]  Rehabilitation Services  Physical Therapy Inpatient Initial Evaluation    Patient Name: Steven Lam  Date of Birth: 10/11/1935  Height: Height: 170.2 cm (5\' 7" )  Weight: Weight: 77.1 kg (170 lb)  Room/Bed: ED19/ED19  Payor: MEDICARE / Plan: MEDICARE PART A AND B / Product Type: Medicare /       PMH:  Past Medical History:   Diagnosis Date    Asymptomatic PVCs     B12 deficiency     Bipolar disorder, unspecified (CMS HCC)     BPH associated with nocturia     Cancer (CMS HCC)     Degenerative cervical spinal stenosis     Eczema of both external ears     Essential hypertension     Gross hematuria     Hearing loss     History of COVID-19     History of rectal fissure     Hydronephrosis     Impacted cerumen of both ears     Left rotator cuff tear     Lower extremity weakness     Mixed hyperlipidemia     Orthostatic hypotension     Parkinson's disease (CMS HCC)     Sleep apnea     Spinal curvature     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)     Upper extremity weakness     Vitamin D deficiency            Assessment:      (P) Patient has been in the ED awaiting rehab or SNF placement which requires PT evaluation. He had increased weakness and decreased ability to walk and move around making it difficult for his ex-wife to care for him. She is requesting rehab or SNF placement for rehab and possible LTC placement based on his progress. During evaluation he completed bed mobility with Dep/MaxA, transfers with ModAx2 and attempted taking small side steps but after 2 steps he experienced significant shaking, tremoring and intermittent giving way of both legs which did not pass and lead to patient having to sit back down on EOB. He was then assisted EOB to supine with Dep/MaxAx2. He will benefit from continued rehab services but since he is not admitted , he will not have continued skilled PT services at this  time. Recommend transfer to rehab or SNF for needed PT/OT and ST services.    Total Distance Ambulated: (P) 2 steps to the left  Independence: (P) maximum assist (25% patient effort), 2 person assist required  Assistive Device: (P) walker, front wheeled      Discharge Needs:    Equipment Recommendation: Has walker and W/C  available at home. May need BSC if discharges to home.      Discharge Disposition: (P) inpatient rehabilitation facility, skilled nursing facility    Plan:   Current Intervention:  PT evaluation only due to patient holding in ED at this time and not admitted.     Subjective & Objective     Past Medical History:   Diagnosis Date    Asymptomatic PVCs     B12 deficiency     Bipolar disorder, unspecified (CMS HCC)     BPH associated with nocturia     Cancer (CMS HCC)     Degenerative cervical spinal stenosis     Eczema of both external ears     Essential hypertension     Gross hematuria  Hearing loss     History of COVID-19     History of rectal fissure     Hydronephrosis     Impacted cerumen of both ears     Left rotator cuff tear     Lower extremity weakness     Mixed hyperlipidemia     Orthostatic hypotension     Parkinson's disease (CMS HCC)     Sleep apnea     Spinal curvature     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)     Upper extremity weakness     Vitamin D deficiency             Past Surgical History:   Procedure Laterality Date    HX BACK SURGERY      x3    HX CATARACT REMOVAL      HX CHOLECYSTECTOMY      HX HIP REPLACEMENT Left     HX TONSILLECTOMY      LAPAROSCOPIC CHOLECYSTECTOMY          08/31/23 0454   Rehab Session   Document Type evaluation   PT Visit Date 08/31/23   General Information   Patient Profile Reviewed yes   Pertinent History of Current Functional Problem Patient to ED on 08/29/23 secondary to increased weakness, right facial droop and slurred speech for over a week and today got on commode and was very difficult for his ex-eife caregiver and her sister to get him back to the W/C  so they called EMS. Prior to the past 2 weeks he could walk with FWW and assistance but had a shuffling gait. A couple weeks ago he was getting unable to walk and had to transition to use of a W/C. Ex-wife feels that she is unable to care for him any longer due to her own physical and medical issues. She is requesting rehab or SNF. He is not admitted to the hospital at this time but will be evaluated to assist in placement and discharge planning.   Medical Lines PIV Line;Telemetry  (external male catheter)   Respiratory Status nasal cannula   Existing Precautions/Restrictions fall precautions   Mutuality/Individual Preferences   Anxieties, Fears or Concerns He is concerned about moving due to tremors and uncontrolled jerking movements   Individualized Care Needs Physical Therapy, Neuro assessment   Living Environment   Lives With other (see comments)  (ex-wife(divorced 10 years but has been assisting in his care for the past 8 years))   Living Arrangements house   Functional Level Prior   Ambulation 3 - assistive equipment and person   Transferring 3 - assistive equipment and person   Toileting 3 - assistive equipment and person   Bathing 3 - assistive equipment and person   Dressing 2 - assistive person   Prior Functional Level Comment Until 2 weeks ago, he was able to walk in home from recliner to table to eat and to/from bathroom with FWW and ex-wife assisting in holding him up. He sleeps and spends most of his time in the recliner chair.   Pre Treatment Status   Pre Treatment Patient Status Patient supine in bed   Support Present Pre Treatment  Family present  (ex-wfe and her sister(has been helping in his care also the past week))   Communication Pre Treatment  Nurse   Communication Pre Treatment Comment cleared for PT eval   Cognitive Assessment/Interventions   Behavior/Mood Observations cooperative;flat affect;other (see comments)  (sleepy but awakens easily to voice and remained awake during activity)  Orientation Status oriented x 3   Attention mild impairment   Follows Commands follows one step commands  (inconsistently)   Pre- Treatment Vital Signs   Pre-Treatment Heart Rate (beats/min) 79   Pre SpO2 (%) 96   O2 Delivery Pre Treatment supplemental O2   Vitals Comment O2 via NC at 2 L/min   Pre-Treatment Pain   Pretreatment Pain Rating 0/10 - no pain   Pre/Posttreatment Pain Comment denies pain and no signs of pain noted   RUE Assessment   RUE Assessment WFL- Within Functional Limits   LUE Assessment   LUE Assessment WFL- Within Functional Limits   RLE Assessment   RLE Assessment X-Exceptions   RLE ROM   (WFL AAROM)   RLE Strength 5/5 hip, knee and ankle   RLE Tone mildly increased tone   RLE Other has uncontrolled, nonrythmic jerking spasms that are occurring while the patient is lying in bed at rest, noted mostly on left side in leg.   LLE Assessment   LLE Assessment X-Exceptions   LLE ROM   (WFL AAROM)   LLE Strength 4+/5 hip, 5/5 knee and ankle   LLE Tone moderately increased tone  (initially)   LLE Other has uncontrolled, nonrythmic jerking spasms that are occurring while the patient is lying in bed at rest, noted mostly on left side in leg.   Trunk Assessment   Trunk Assessment WFL for stated baseline   Mobility Assessment/Training   Additional Documentation Bed Mobility Assessment/Treatment (Group);Transfer Assessment/Treatment (Group);Gait Assessment/Treatment (Group)   Bed Mobility   Bed Mobility, Assistive Device bed rails;draw sheet;other (see comments)  (pulled on therapist's hand)   Comment Difficulty maintaining upright sitting without falling backwards, diffiulty maintaining feet on floor due to tipping backwards.  Requires MinA to maintain sitting balance.   Roll Left Independence maximum assist (25% patient effort)   Roll Right Independence maximum assist (25% patient effort)   Scoot/Bridge Independence dependent (less than 25% patient effort);maximum assist (25% patient effort)   Sit to  Supine, Independence dependent (less than 25% patient effort);maximum assist (25% patient effort);2 person assist required   Supine-Sit Independence dependent (less than 25% patient effort);maximum assist (25% patient effort);2 person assist required   Transfer Assessment/Treatment   Transfer Comment stood at EOB for 1 minute with MinA and FWW, then tried to progress with moving his feet   Sit-Stand-Sit, Assist Device walker, front wheeled   Sit-Stand Independence moderate assist (50% patient effort);2 person assist required   Stand-Sit Independence moderate assist (50% patient effort);2 person assist required   Gait Assessment/Treatment   Total Distance Ambulated 2   Assistive Device  walker, front wheeled   Comment Due to inability to stop leg shaking and tremoring, he had to be assisted with ModAx2 to sitting on EOB and then EOB to supine with Dep/MaxAx2 and Depx2 for repositioning up in the bed. He was unable to walk at eval.   Distance in Feet 2 small steps to the left to move up toward the [facility]   Independence  maximum assist (25% patient effort);2 person assist required   Maintain Weight Bearing Status physical assist to maintain   Safety Issues  other (see comments)  (when patient took small steps and tried active movement of BLE, he began to have shaking, tremoring and was unable to stand steadily through his legs. Therapist had him try to regain control for 8-10 seconds but shaking and legs giving way continued.)   Motor Skills/Interventions   Additional Documentation Licensed conveyancer (  Group)   Balance   Sitting, Dynamic (Balance) poor + balance   Sitting Balance: Static poor + balance   Standing Balance: Dynamic poor balance   Sit-to-Stand Balance poor balance   Standing Balance: Static poor + balance   Post Treatment Status   Post Treatment Patient Status Patient supine in bed;Call light within reach;Patient safety alarm activated   Support Present Post Treatment  Family present   Estate manager/land agent Nurse   Communication Post Treatment Comment update on status of eval   Patient Effort fair   Post-Treatment Vital Signs   Post-treatment Heart Rate (beats/min) 84   Post SpO2 (%) 94   O2 Delivery Post Treatment supplemental O2   Post-Treatment Pain   Posttreatment Pain Rating 0/10 - no pain   Physical Therapy Clinical Impression   Assessment Patient has been in the ED awaiting rehab or SNF placement which requires PT evaluation. He had increased weakness and decreased ability to walk and move around making it difficult for his ex-wife to care for him. She is requesting rehab or SNF placement for rehab and possible LTC placement based on his progress. During evaluation he completed bed mobility with Dep/MaxA, transfers with ModAx2 and attempted taking small side steps but after 2 steps he experienced significant shaking, tremoring and intermittent giving way of both legs which did not pass and lead to patient having to sit back down on EOB. He was then assisted EOB to supine with Dep/MaxAx2. He will benefit from continued rehab services but since he is not admitted , he will not have continued skilled PT services at this time. Recommend transfer to rehab or SNF for needed PT/OT and ST services.   Criteria for Skilled Therapeutic other (see comments)  (has need for continued skilled rehab services but since he is being held in ED and not admitted, can not continue PT services at this time in this setting. No goals will be set.)   Impairments Found (describe specific impairments) aerobic capacity/endurance;arousal, attention, and cognition;gait, locomotion, and balance;motor function;muscle performance;neuromuscular;ventilation and respiration/gas exchange   Functional Limitations in Following  self-care;community/leisure   Rehab Potential fair   Anticipated Discharge Disposition inpatient rehabilitation facility;skilled nursing facility   Evaluation Complexity Justification   Patient History:  Co-morbidity/factors that impact Plan of Care 3 or more that impact Plan of Care   Examination Components 4 or more Exam elements addressed   Presentation Evolving: Symptoms, complaints, characteristics of condition changing &/or cognitive deficits present   Clinical Decision Making Moderate complexity   Evaluation Complexity Moderate complexity   Physical Therapy Time and Intention   Total PT Minutes: 29               INTERVENTION MINUTES: EVALUATION 20 minutes and THERAPEUTIC ACTIVITY 9 minutes    EVALUATION COMPLEXITY : CLINICAL DECISION MAKING OF MODERATE COMPLEXITY AS INDICATED BY PMHX, PHYSICAL THERAPY ASSESSMENT OF MUSCULOSKELETAL AND NEUROLOGICAL SYSTEMS AND ACTIVITY LIMITATIONS. CLINICAL PRESENTATION HAS CHANGING CHARACTERISTICS.    Therapist:     Rollene Rotunda, PT  08/31/2023, 11:03

## 2023-08-31 NOTE — ED Nurses Note (Signed)
Pt repositioned at this time.

## 2023-08-31 NOTE — Discharge Instructions (Signed)
Patient should continue all home medications as prescribed.  Regular diet as tolerated. Return to the ER for other emergencies or as needed

## 2023-08-31 NOTE — Care Management Notes (Signed)
[facility]  Care Management Note    Patient Name: Steven Lam  Date of Birth: September 19, 1935  Sex: male  Date/Time of Admission: 08/29/2023 10:02 AM  Room/Bed: ED19/ED19  Payor: MEDICARE / Plan: MEDICARE PART A AND B / Product Type: Medicare /    LOS: 0 days   Primary Care Providers:  Mickey Farber, DO, DO (General)        Assessment:   Received call from [facility] with [facility], advising they do have an available bed for pt today.  Pt will be going into room #207A. ERP informed of the above.    Discharge Plan:  Nursing Facility-Primary Residence/Not Skilled (code 1)  Pt will be transferring to [facility]        Case Manager: Bing Plume, SOCIAL WORKER  Phone: [phone]

## 2023-08-31 NOTE — ED Nurses Note (Signed)
Pt placed on oxymask at this time as he sleeps

## 2023-08-31 NOTE — Care Management Notes (Signed)
Spoke with Apolinar Junes with [facility] this AM. Medicaid has not been approved as previously stated by pt's ex-wife/caregiver. The case is pended due to needed records and financials from pt.

## 2023-08-31 NOTE — ED Nurses Note (Signed)
Patient repositioned and turned at this time. Patient resting with eyes closed, breaths even and unlabored. No signs of acute distress noted. Call bell in reach.

## 2023-08-31 NOTE — Care Management Notes (Signed)
CASE ID CASE CONTRACT SUBMITTED ON COMPLETED ON REASON OUTCOME   [id number] [facility] 08/31/2023 8:49:05 AM  L1 Level 1 Approved - Pending Review

## 2023-08-31 NOTE — ED Nurses Note (Addendum)
This nurse into patient's room. Patient repositioned and turned in hospital bed. Patient denies any needs at this time. Patient on monitor and call bell in reach.

## 2023-08-31 NOTE — ED Nurses Note (Signed)
Report called to [facility] RN at this time.

## 2023-08-31 NOTE — ED Nurses Note (Signed)
Bed bath given at this time. Pt repositioned and sacral mepiplex applied at this time.

## 2023-08-31 NOTE — Care Management Notes (Signed)
[facility]  Care Management Note    Patient Name: Steven Lam  Date of Birth: February 09, 1936  Sex: male  Date/Time of Admission: 08/29/2023 10:02 AM  Room/Bed: ED19/ED19  Payor: MEDICARE / Plan: MEDICARE PART A AND B / Product Type: Medicare /    LOS: 0 days   Primary Care Providers:  Mickey Farber, DO, DO (General)        Assessment:   Pt's MPOA met with Apolinar Junes [place] to provide additional documentation to complete LTC MCD application.  Cm was notified by Apolinar Junes that pt should be good for LTC placement.   Cm contacted Tammy Crews/[facility] to advise  of LTC MCD status.  Tammy said that she would see if they have an available bed for pt today.      Discharge Plan:  Nursing Facility-Primary Residence/Not Skilled (code 1)  Awaiting call back from [facility]    The patient will continue to be evaluated for developing discharge needs.     Case Manager: Bing Plume, SOCIAL WORKER  Phone: [phone]

## 2023-08-31 NOTE — ED Nurses Note (Addendum)
Patient's brief changed per request of patient. Patient's oxygen saturation down to 86 while this nurse in room. Patient put on 2L NC at this time and told to take some deep breaths. Provider informed. Patient repositioned and turned at this time.

## 2023-08-31 NOTE — Speech Evaluation (Signed)
[facility] Medicine [facility]  [address]  [place], [address]  ([phone]  (Fax) [phone]  Rehabilitation Services  Speech Therapy Inpatient Evaluation          Patient Name: Steven Lam  Date of Birth: 12-26-1935  Weight:  Weight: 77.1 kg (170 lb)  Room/Bed: ED19/ED19  Payor: MEDICARE / Plan: MEDICARE PART A AND B / Product Type: Medicare /     PMH:   Past Medical History:   Diagnosis Date    Asymptomatic PVCs     B12 deficiency     Bipolar disorder, unspecified (CMS HCC)     BPH associated with nocturia     Cancer (CMS HCC)     Degenerative cervical spinal stenosis     Eczema of both external ears     Essential hypertension     Gross hematuria     Hearing loss     History of COVID-19     History of rectal fissure     Hydronephrosis     Impacted cerumen of both ears     Left rotator cuff tear     Lower extremity weakness     Mixed hyperlipidemia     Orthostatic hypotension     Parkinson's disease (CMS HCC)     Sleep apnea     Spinal curvature     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)     Upper extremity weakness     Vitamin D deficiency                  JUSTIFICATION OF DISCHARGE RECOMMENDATION   Based on current diagnosis, functional performance prior to admission, and current functional performance, this patient DOESrequires continued SLP services in   in order to achieve significant functional improvements for Swallowing.      Assessment:  Assessment: (P) Patient is 88 yr male admitted 08/29/23. His exwife who is his Primary caretaker is with him. Hx includes Parkinsons features. Ex wife reports he became dead weight when attempting to take him to the bathroom and she had no choice but to all EMS. She reports he has not been able to try to eat or drink this morning. Clinician attempts to provide straw and spoon and he in fact is unable to accept either of these at this time. Recommend: Pureed diet and THICK Liquids, sit up for all PO intake, 1:1 assist for all PO intake, aspiration  precautions.  ST t o follow     Plan:   Evaluation and treat. Will provide speech therapy services for the following deficits: swallow as indicated. The risks and benefits of therapy have been discussed with the patient/caregiver and he/she is in agreement with the established POC.    SLP Diet Recommendation:  SLP Diet Recommendation: (P) nectar thick liquids, puree       Recent CXR:   Recent Results (from the past 720 hour(s))   XR AP MOBILE CHEST    Collection Time: 08/31/23  6:09 AM    Narrative    Windy Fast Eskew    RADIOLOGIST: Alvester Chou, MD    XR AP MOBILE CHEST performed on 08/31/2023 6:09 AM    CLINICAL HISTORY: Decreased O2 saturation.  Decreased O2 saturation    TECHNIQUE: Frontal view of the chest.    COMPARISON:  None    FINDINGS:  Surgical hardware in the thoracic spine   The heart size is normal.   There are some prominent interstitial markings which could be chronic but no prior  chest x-rays available for comparison. No pulmonary edema or large consolidation.           Impression    NO ACUTE FINDINGS.        Radiologist location ID: [id number]          Subjective & Objective   Flow sheet information    08/31/23 0915   Rehab Session   Document Type   (Clinical Swalloweval)   Total SLP Minutes: 15   Patient Effort poor   General Information   Patient Profile Reviewed yes   General Observations of Patient Patient seen i n ED with exwife who is his primary caregiver present. Patient is nonverbal at this time but awake   Cognition   Behavior/Mood Observations alert;other (see comments)  (nonverbal unable to follow directions)   Orientation Status disoriented to;person;place;situation;time   Attention needs cues to re-direct   Follows Commands does not follow one step commands   Oral Motor Structure and Function    Additional Documentation   (Unable to follow commands)   Non Instrumental/Clinical Swallow (NIS)   Additional Documentation   (Patient does not open mouth to try to accept straw or spoon.)    SLP Clinical Impression   Assessment Patient is 88 yr male admitted 08/29/23. His exwife who is his Primary caretaker is with him. Hx includes Parkinsons features. Ex wife reports he became dead weight when attempting to take him to the bathroom and she had no choice but to all EMS. She reports he has not been able to try to eat or drink this morning. Clinician attempts to provide straw and spoon and he in fact is unable to accept either of these at this time. Recommend: Pureed diet and THICK Liquids, sit up for all PO intake, 1:1 assist for all PO intake, aspiration precautions.  ST t o follow   Swallowing Clinical Impression   SLP Swallowing Diagnosis severe dysphagia;oral dysfunction   Rehab Potential/Prognosis (Swallow Eval) re-evaluate goals as necessary   Criteria for Skilled Rhunette Croft Met demonstrates skilled criteria for intervention   Therapy Frequency 2-3 times/wk   Predicted Duration Therapy Interv (days) until discharge   Expected Duration Therapy Session - minutes 15-30 minutes   SLP Diet Recommendation nectar thick liquids;puree   Recommended Feeding/Eating Techniques (Swallow Eval) provide assist with feeding;maintain upright posture during/after eating for 30 minutes;maintain upright sitting position for eating   Signs/Symptoms of Aspiration Noted (Swallowing) none   Plan of care reviewed with: Patient;RN   Eating/Swallowing Management Strategies/Techniques   Monitoring/Assistance Required (Eating/Swallowing) stop eating activities when fatigue is present;monitor for cough or change in vocal quality with intake;cue for finger/lingual sweep if oral pocketing present   Strategies to Enhance Eating/Swallowing allow adequate time for eating;observe closely for symptoms of aspiration;provide adequate postural support during eating;provide positioning assistance to enhance swallowing;upright sitting position for eating   Dysphagia Goals, SLP   Date Established (Dysphagia Goal, SLP) 08/31/23   Time  Frame (Dysphagia Goal, SLP) by discharge   Dysphagia Goal, Oral Nutritional Level Goal no signs/symptoms of aspiration present;safely tolerates recommended diet texture;safely tolerates recommended liquid viscosity;safely tolerates/maintains adequate oral nutrition   Dysphagia Goal, Identified Barriers safety awareness,cognition          Speech intervention minutes: SWALLOW EVALUATION 15 MINUTES      Therapist:  Jacky Kindle, ST,08/31/2023,10:14

## 2023-08-31 NOTE — ED Nurses Note (Signed)
Patient repositioned and turned in hospital bed. Patient denies any needs at this time. Patient on monitor and call bell in reach.

## 2023-08-31 NOTE — ED Nurses Note (Signed)
PT at bedside at this time

## 2023-08-31 NOTE — ED Nurses Note (Signed)
Patient repositioned and turned in hospital bed. Patient denies any needs at this time. Patient on monitor and call bell in reach. VSS. No signs of acute distress noted at this time.

## 2023-08-31 NOTE — ED APP Handoff Note (Signed)
Emergency Medicine      Name: Steven Lam  Age and Gender: 88 y.o. male  Date of Birth: 1935-09-01  MRN: [id number]    ED Course Since Sign Out:  Care assumed of patient awaiting SNF placement and disposition. It seems the patient likely has sleep apnea, as his O2 drops when he is sleeping. Hospitalist was contacted for admission but did not feel patient met admission criteria. Case management involved and was able to secure placement for the patient at [facility]. He will be discharged in stable condition.       Filed Vitals:    08/31/23 1000 08/31/23 1100 08/31/23 1208 08/31/23 1245   BP: 111/64 122/65 (!) 101/52    Pulse: 79 76 87 82   Resp: (!) 11 (!) 11 (!) 26 12   Temp:       SpO2: 95% 91% (!) 85% 97%           Impression:   Clinical Impression   General physical deterioration (Primary)   Parkinson's disease (tremor, stiffness, slow motion, unstable posture) (CMS HCC)         Disposition: Discharged      Discharge Instructions Given to Patient:      / Maryagnes Amos PA-C  08/31/2023, 13:54  [facility]  Department of Emergency Medicine  [facility]      Portions of this note may have been dictated using voice recognition software.

## 2023-09-07 ENCOUNTER — Ambulatory Visit: Payer: Medicare Other | Attending: INTERNAL MEDICINE | Admitting: INTERNAL MEDICINE

## 2023-09-07 DIAGNOSIS — R319 Hematuria, unspecified: Secondary | ICD-10-CM | POA: Insufficient documentation

## 2023-09-08 ENCOUNTER — Ambulatory Visit: Payer: Medicare Other | Attending: INTERNAL MEDICINE | Admitting: INTERNAL MEDICINE

## 2023-09-08 DIAGNOSIS — R319 Hematuria, unspecified: Secondary | ICD-10-CM | POA: Insufficient documentation

## 2023-09-08 LAB — URINALYSIS, MICROSCOPIC
RBCS: 33 /[HPF] — ABNORMAL HIGH (ref <4)
WBCS: 28 /[HPF] — ABNORMAL HIGH (ref <6)

## 2023-09-08 LAB — URINALYSIS, MACROSCOPIC
BILIRUBIN: NEGATIVE mg/dL
BLOOD: 0.06 mg/dL — AB
GLUCOSE: NEGATIVE mg/dL
KETONES: NEGATIVE mg/dL
LEUKOCYTES: 75 WBCs/uL — AB
NITRITE: NEGATIVE
PH: 6 (ref 5.0–9.0)
PROTEIN: 20 mg/dL
SPECIFIC GRAVITY: 1.023 (ref 1.002–1.030)
UROBILINOGEN: NORMAL mg/dL

## 2023-09-09 ENCOUNTER — Ambulatory Visit: Payer: Medicare Other | Attending: INTERNAL MEDICINE | Admitting: INTERNAL MEDICINE

## 2023-09-09 DIAGNOSIS — R82998 Other abnormal findings in urine: Secondary | ICD-10-CM | POA: Insufficient documentation

## 2023-09-09 LAB — URINALYSIS, MACROSCOPIC
BILIRUBIN: NEGATIVE mg/dL
BLOOD: 0.06 mg/dL — AB
GLUCOSE: NEGATIVE mg/dL
KETONES: NEGATIVE mg/dL
LEUKOCYTES: 75 WBCs/uL — AB
NITRITE: NEGATIVE
PH: 6 (ref 5.0–9.0)
PROTEIN: 10 mg/dL
SPECIFIC GRAVITY: 1.022 (ref 1.002–1.030)
UROBILINOGEN: NORMAL mg/dL

## 2023-09-09 LAB — URINALYSIS, MICROSCOPIC
RBCS: 17 /[HPF] — ABNORMAL HIGH (ref <4)
WBCS: 24 /[HPF] — ABNORMAL HIGH (ref <6)

## 2023-09-09 LAB — URINE CULTURE: URINE CULTURE: NO GROWTH

## 2023-09-10 ENCOUNTER — Ambulatory Visit: Payer: Medicare Other | Attending: INTERNAL MEDICINE | Admitting: INTERNAL MEDICINE

## 2023-09-10 DIAGNOSIS — R319 Hematuria, unspecified: Secondary | ICD-10-CM | POA: Insufficient documentation

## 2023-09-10 LAB — URINE CULTURE,ROUTINE: URINE CULTURE: NO GROWTH

## 2023-09-10 LAB — H & H
HCT: 44.9 % (ref 36.7–47.1)
HGB: 14.9 g/dL (ref 12.5–16.3)

## 2023-09-11 LAB — URINE CULTURE,ROUTINE: URINE CULTURE: NO GROWTH

## 2023-09-17 ENCOUNTER — Emergency Department
Admission: EM | Admit: 2023-09-17 | Discharge: 2023-09-17 | Disposition: A | Discharge status: Skilled Nursing Facility | Payer: Medicare Other | Attending: Emergency Medicine | Admitting: Emergency Medicine

## 2023-09-17 ENCOUNTER — Other Ambulatory Visit: Payer: Self-pay

## 2023-09-17 ENCOUNTER — Encounter (HOSPITAL_COMMUNITY): Payer: Self-pay

## 2023-09-17 DIAGNOSIS — G20B1 Parkinson's disease with dyskinesia, without mention of fluctuations: Secondary | ICD-10-CM

## 2023-09-17 DIAGNOSIS — R310 Gross hematuria: Secondary | ICD-10-CM

## 2023-09-17 DIAGNOSIS — Z8249 Family history of ischemic heart disease and other diseases of the circulatory system: Secondary | ICD-10-CM

## 2023-09-17 DIAGNOSIS — N400 Enlarged prostate without lower urinary tract symptoms: Secondary | ICD-10-CM

## 2023-09-17 LAB — URINALYSIS, MACROSCOPIC
BILIRUBIN: NEGATIVE mg/dL
BLOOD: 0.2 mg/dL — AB
GLUCOSE: NEGATIVE mg/dL
KETONES: NEGATIVE mg/dL
LEUKOCYTES: 75 WBCs/uL — AB
NITRITE: NEGATIVE
PH: 7 (ref 5.0–9.0)
PROTEIN: 100 mg/dL — AB
SPECIFIC GRAVITY: 1.02 (ref 1.002–1.030)
UROBILINOGEN: NORMAL mg/dL

## 2023-09-17 LAB — URINALYSIS, MICROSCOPIC: WBCS: 3 /[HPF] (ref <6)

## 2023-09-17 NOTE — ED Nurses Note (Signed)
Called [facility] and spoke to [place] and she said that she would attempt to get medical records to send me a copy of his ultrasound that was completed. She also said that she was told patient had red tinged urine but today he began developing blood clots in his urine. She was unsure of his diagnosis for Hospice admission.

## 2023-09-17 NOTE — ED Nurses Note (Signed)
Called report to [place] at [facility]. Informed her that patient would be coming back and is being discharged due to being Hospice care. Patient does not complain of any feelings of urinary retention at this time.

## 2023-09-17 NOTE — ED Nurses Note (Signed)
Patient to ED28 via EMS from [facility] with complaints of urinating blood clots over the last week. Physician student contacted wife Janett Billow on the telephone and was asking about the urination and bleeding. She said that patient had been urinating blood at the nursing home and they did an ultrasound and said he had an enlarged prostate. Patient reports that he has previously done this in the past. Patient asked that we not insert a catheter at this time due to knowing when he needed to void. Patient brief from nursing home assessed and there was a few blood clots present in the brief. Patient reports that he will attempt to use a urinal instead of straight cath. He reports extreme burning upon urination.

## 2023-09-17 NOTE — ED Nurses Note (Signed)
Ultrasound of pelvis received from [facility] on 09/11/23. No bladder wall masses. No stones. Enlarged prostate which was negative for urinary retention. Patient already taking Flomax 0.4mg  daily.

## 2023-09-17 NOTE — ED Nurses Note (Signed)
Attempted to call report to [facility] at this time.

## 2023-09-17 NOTE — ED Nurses Note (Signed)
Discharged back to nursing home at this time. PRS in facility to take patient back to [facility].

## 2023-09-17 NOTE — ED Nurses Note (Signed)
Attempted to call Steven Lam to inform her patient was being transferred back to nursing home. NO answer.

## 2023-09-17 NOTE — ED Triage Notes (Signed)
Sent by [facility] for evaluation by request of family. Pt has been passing clots from urethra. Pt on hospice    PRS: transport

## 2023-09-17 NOTE — ED Provider Notes (Addendum)
[facility]  ED Primary Provider Note  Patient Name: Steven Lam  Patient Age: 88 y.o.  Date of Birth: 23-Jul-1936    Chief Complaint: Hematuria        History of Present Illness       Steven Lam is a 87 y.o. male who had concerns including Hematuria.     HPI      chief complaint blood in the urine     HPI   Severity moderate to severe   Onset acute   Timing yesterday but much worse today   Quality: Bright red blood with clots   Mechanism: Patient has a history gross hematuria in the past.  Special considerations: Patient is a nursing home now in his part of [facility].    Anticoagulation: None  Injuries: None  Recent instrumentation: None  Additional symptoms: No weakness, fatigue or malaise.  He has no fever no chills.  Denies any abdominal pain.  There is no vomiting no diarrhea.  No recent falls.  Recent medical attention:  Patient did have an outpatient ultrasound by Dr. Cecilie Lowers.  This was known to be normal.  Copy of this below  PMH: reviewed and listed on the chart below.   SH: reviewed and listed on the chart below.   Social History: reviewed and listed on the chart below.   Family History: reviewed and listed on the chart below.       PMHx:    Past Medical History:   Diagnosis Date    Asymptomatic PVCs     B12 deficiency     Bipolar disorder, unspecified (CMS HCC)     BPH associated with nocturia     Cancer (CMS HCC)     Degenerative cervical spinal stenosis     Eczema of both external ears     Essential hypertension     Gross hematuria     Hearing loss     History of COVID-19     History of rectal fissure     Hydronephrosis     Impacted cerumen of both ears     Left rotator cuff tear     Lower extremity weakness     Mixed hyperlipidemia     Orthostatic hypotension     Parkinson's disease (CMS HCC)     Sleep apnea     Spinal curvature     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)     Upper extremity weakness     Vitamin D deficiency     PSHx:   Past Surgical History:    Procedure Laterality Date    HX BACK SURGERY      x3    HX CATARACT REMOVAL      HX CHOLECYSTECTOMY      HX HIP REPLACEMENT Left     HX TONSILLECTOMY      LAPAROSCOPIC CHOLECYSTECTOMY         Allergies:    Allergies   Allergen Reactions    Aspirin      Other reaction(s): Other (See Comments)  Per patient "stomach bleed"    Social History  Social History     Tobacco Use    Smoking status: Former     Current packs/day: 0.00     Average packs/day: 1 pack/day for 10.0 years (10.0 ttl pk-yrs)     Types: Cigarettes     Start date: 08/04/1958     Quit date: 08/04/1968     Years since quitting: 55.1    Smokeless tobacco:  Never   Vaping Use    Vaping status: Never Used   Substance Use Topics    Alcohol use: Never    Drug use: Never       Family History  Family Medical History:       Problem Relation (Age of Onset)    Colon Cancer Mother    Dementia Mother    Diabetes type II Brother    Heart Attack Father, Brother               Home Meds:      Prior to Admission medications    Medication Sig Start Date End Date Taking? Authorizing Provider   carbidopa-Levodopa (SINEMET) 25-100 mg Oral Tablet Take 1 Tablet by mouth Twice daily 08/28/23   Provider, Historical   cholecalciferol, vitamin D3, 25 mcg (1,000 unit) Oral Tablet Take 1 Tablet (1,000 Units total) by mouth Once a day    Provider, Historical   Fluocinolone Acetonide Oil (DERMOTIC OIL) 0.01 % Otic Drops 4 gtts each ear bid x 7 days 10/10/21   Conchita Paris, DO   latanoprost (XALATAN) 0.005 % Ophthalmic Drops INSTILL 1 DROP INTO BOTH EYES AT BEDTIME 12/26/22   Long, Marshall, DO   lisinopriL (PRINIVIL) 10 mg Oral Tablet TAKE 1 TABLET BY MOUTH EVERY DAY FOR BLOOD PRESSURE 10/31/22   Long, Gaynell Face, DO   multivitamin with minerals Oral Tablet Take 1 Tablet by mouth 02/26/11   Provider, Historical   PARoxetine (PAXIL) 20 mg Oral Tablet Take 1 Tablet (20 mg total) by mouth Once a day 05/18/23   Long, Gaynell Face, DO   QUEtiapine (SEROQUEL) 25 mg Oral Tablet TAKE 1/2 TABLET BY MOUTH EVERY  DAY AT BEDTIME 11/10/22   Long, Gaynell Face, DO   tamsulosin (FLOMAX) 0.4 mg Oral Capsule Take 1 Capsule (0.4 mg total) by mouth Once a day  Patient taking differently: Take 1 Capsule (0.4 mg total) by mouth Twice daily 02/26/22   Long, Gaynell Face, DO   triamcinolone acetonide 0.1 % Cream Apply topically Twice daily 10/14/22   Long, Gaynell Face, DO          ROS: A total of 10 systems were reviewed by me at the time of the visit. All are negative except the HPI and the following.           Physical Exam   ED Triage Vitals [09/17/23 1201]   BP (Non-Invasive) (!) 144/80   Heart Rate 72   Respiratory Rate 16   Temperature 36.5 C (97.7 F)   SpO2 93 %   Weight 77.1 kg (170 lb)   Height 1.702 m (5\' 7" )         Physical exam  Constitutional/general: The nursing notes were reviewed and agreed upon.  The vital signs were reviewed and are listed on the chart.   88 year old well-appearing male who is in no acute distress.  Patient appears to be comfortable and in no pain.  HEENT: Eyes show normal extraocular movements.  Well-hydrated oral mucosa is noted.  There is no facial trauma or abnormalities.  Neck: No midline tenderness, no meningeal signs.  Full range of motion.  No JVD.    Cardiovascular: Heart is regular rate and rhythm S1-S2 sounds were auscultated without murmur click or rub  Respiratory: Lungs are clear to auscultation in all fields without wheeze rale or rhonchi.  GI: Abdomen is soft non tender normal bowel sounds are auscultated.  There is no rebound tenderness or guarding  Neuro cranial nerves II through XII  are intact and normal.  Patient has slow deliberate speech and normal articulation.  There is generalized muscle weakness in all extremities.  Sensation is intact throughout.  Patient does have slow initiation of muscle activity.  Psych:  Patient is alert and interacting very well with his family and the examiner.  It is very difficult to determine any anxiety depression as he does have masked facies associated with  Parkinson's disease.      Skin: No rash.  No petechiae or purpura.  The skin is warm and dry without diaphoresis.  There is no pallor.  Musculoskeletal: There is no tenderness to palpation in any body region.  There is no lower extremity edema and no calf tenderness.  Negative Homan's sign bilaterally.  GU: Deferred        Procedures      Patient Data   Labs Ordered/Reviewed   URINALYSIS, MACROSCOPIC - Abnormal; Notable for the following components:       Result Value    COLOR Red (*)     APPEARANCE Turbid (*)     LEUKOCYTES 75 (*)     PROTEIN 100 (*)     BLOOD 0.2 (*)     All other components within normal limits    Narrative:     Substances that cause abnormal urine color may affect the readability of the urinalysis reagent strips. The color development on the reagant pad may be masked and could be interpreted as a false positive.     URINE CULTURE,ROUTINE   URINALYSIS, MACROSCOPIC AND MICROSCOPIC W/CULTURE REFLEX    Narrative:     The following orders were created for panel order URINALYSIS, MACROSCOPIC AND MICROSCOPIC W/CULTURE REFLEX [PRN ONLY].  Procedure                               Abnormality         Status                     ---------                               -----------         ------                     URINALYSIS, MACROSCOPIC[[id number]]      Abnormal            Final result               URINALYSIS, MICROSCOPIC[[id number]]                          Final result                 Please view results for these tests on the individual orders.   URINALYSIS, MICROSCOPIC                          No orders to display                Medical Decision Making  Amount and/or Complexity of Data Reviewed  External Data Reviewed: radiology.     Details: Ultrasound as an outpatient is normal.  Labs: ordered. Decision-making details documented in ED Course.  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  Emergency Medicine  [facility]          This note may have been partially generated using MModal Fluency Direct system, and there may be some incorrect words, spellings, and punctuation that were not noted in checking the note before saving, though effort was made to avoid such errors.

## 2023-09-17 NOTE — ED Nurses Note (Signed)
Patient voided appx 50 ml of red/ bloody urine with 1 small blood clot present.

## 2023-09-19 LAB — URINE CULTURE,ROUTINE: URINE CULTURE: NO GROWTH

## 2023-09-27 ENCOUNTER — Other Ambulatory Visit (INDEPENDENT_AMBULATORY_CARE_PROVIDER_SITE_OTHER): Payer: Self-pay | Admitting: Family Medicine

## 2023-10-30 ENCOUNTER — Ambulatory Visit: Attending: INTERNAL MEDICINE | Admitting: INTERNAL MEDICINE

## 2023-10-30 DIAGNOSIS — R059 Cough, unspecified: Secondary | ICD-10-CM | POA: Insufficient documentation

## 2023-10-30 LAB — COVID-19, FLU A/B, RSV RAPID BY PCR
INFLUENZA VIRUS TYPE A: NOT DETECTED
INFLUENZA VIRUS TYPE B: NOT DETECTED
RESPIRATORY SYNCTIAL VIRUS (RSV): NOT DETECTED
SARS-CoV-2: NOT DETECTED

## 2023-11-02 ENCOUNTER — Other Ambulatory Visit: Payer: Self-pay

## 2023-11-11 ENCOUNTER — Other Ambulatory Visit (INDEPENDENT_AMBULATORY_CARE_PROVIDER_SITE_OTHER): Payer: Self-pay | Admitting: Family Medicine

## 2023-11-11 DIAGNOSIS — Z8659 Personal history of other mental and behavioral disorders: Secondary | ICD-10-CM

## 2023-12-01 ENCOUNTER — Ambulatory Visit: Attending: INTERNAL MEDICINE | Admitting: INTERNAL MEDICINE

## 2023-12-01 DIAGNOSIS — R319 Hematuria, unspecified: Secondary | ICD-10-CM | POA: Insufficient documentation

## 2023-12-01 LAB — CBC WITH DIFF
BASOPHIL #: 0 10*3/uL (ref 0.00–0.10)
BASOPHIL %: 0 % (ref 0–1)
EOSINOPHIL #: 0.2 10*3/uL (ref 0.00–0.50)
EOSINOPHIL %: 5 % (ref 1–8)
HCT: 41.7 % (ref 36.7–47.1)
HGB: 13.8 g/dL (ref 12.5–16.3)
LYMPHOCYTE #: 1 10*3/uL (ref 1.00–3.20)
LYMPHOCYTE %: 22 % (ref 15–43)
MCH: 29.1 pg (ref 23.8–33.4)
MCHC: 33 g/dL (ref 32.5–36.3)
MCV: 88.2 fL (ref 73.0–96.2)
MONOCYTE #: 0.5 10*3/uL (ref 0.30–1.10)
MONOCYTE %: 12 % (ref 6–14)
MPV: 8.1 fL (ref 7.4–11.4)
NEUTROPHIL #: 2.9 10*3/uL (ref 1.70–7.60)
NEUTROPHIL %: 62 % (ref 44–74)
PLATELETS: 218 10*3/uL (ref 140–440)
RBC: 4.72 10*6/uL (ref 4.06–5.63)
RDW: 14.5 % (ref 12.1–16.2)
WBC: 4.8 10*3/uL (ref 3.6–10.2)

## 2023-12-01 LAB — BASIC METABOLIC PANEL
ANION GAP: 5 mmol/L (ref 4–13)
BUN/CREA RATIO: 28 — ABNORMAL HIGH (ref 6–22)
BUN: 19 mg/dL (ref 7–25)
CALCIUM: 9 mg/dL (ref 8.6–10.3)
CHLORIDE: 106 mmol/L (ref 98–107)
CO2 TOTAL: 31 mmol/L (ref 21–31)
CREATININE: 0.67 mg/dL (ref 0.60–1.30)
ESTIMATED GFR: 91 mL/min/{1.73_m2} (ref >59)
GLUCOSE: 96 mg/dL (ref 74–109)
OSMOLALITY, CALCULATED: 285 mosm/kg (ref 270–290)
POTASSIUM: 4.2 mmol/L (ref 3.5–5.1)
SODIUM: 142 mmol/L (ref 136–145)

## 2024-04-14 ENCOUNTER — Ambulatory Visit: Attending: INTERNAL MEDICINE | Admitting: INTERNAL MEDICINE

## 2024-04-14 DIAGNOSIS — G252 Other specified forms of tremor: Secondary | ICD-10-CM | POA: Insufficient documentation

## 2024-04-14 LAB — COMPREHENSIVE METABOLIC PANEL, NON-FASTING
ALBUMIN/GLOBULIN RATIO: 1.5 — ABNORMAL HIGH (ref 0.8–1.4)
ALBUMIN: 3.7 g/dL (ref 3.5–5.7)
ALKALINE PHOSPHATASE: 100 U/L (ref 34–104)
ALT (SGPT): 20 U/L (ref 7–52)
ANION GAP: 6 mmol/L (ref 4–13)
AST (SGOT): 26 U/L (ref 13–39)
BILIRUBIN TOTAL: 0.5 mg/dL (ref 0.3–1.0)
BUN/CREA RATIO: 27 — ABNORMAL HIGH (ref 6–22)
BUN: 20 mg/dL (ref 7–25)
CALCIUM, CORRECTED: 9.1 mg/dL (ref 8.9–10.8)
CALCIUM: 8.9 mg/dL (ref 8.6–10.3)
CHLORIDE: 107 mmol/L (ref 98–107)
CO2 TOTAL: 29 mmol/L (ref 21–31)
CREATININE: 0.73 mg/dL (ref 0.60–1.30)
ESTIMATED GFR: 89 mL/min/1.73mˆ2 (ref >59)
GLOBULIN: 2.5 (ref 2.0–3.5)
GLUCOSE: 71 mg/dL — ABNORMAL LOW (ref 74–109)
OSMOLALITY, CALCULATED: 284 mosm/kg (ref 270–290)
POTASSIUM: 4.2 mmol/L (ref 3.5–5.1)
PROTEIN TOTAL: 6.2 g/dL — ABNORMAL LOW (ref 6.4–8.9)
SODIUM: 142 mmol/L (ref 136–145)

## 2024-04-14 LAB — PHOSPHORUS: PHOSPHORUS: 2.2 mg/dL — ABNORMAL LOW (ref 3.7–7.2)

## 2024-04-14 LAB — CBC
HCT: 45.1 % (ref 36.7–47.1)
HGB: 15 g/dL (ref 12.5–16.3)
MCH: 29.3 pg (ref 23.8–33.4)
MCHC: 33.2 g/dL (ref 32.5–36.3)
MCV: 88.2 fL (ref 73.0–96.2)
MPV: 8.4 fL (ref 7.4–11.4)
PLATELETS: 191 x10ˆ3/uL (ref 140–440)
RBC: 5.12 x10ˆ6/uL (ref 4.06–5.63)
RDW: 14.9 % (ref 12.1–16.2)
WBC: 5.2 x10ˆ3/uL (ref 3.6–10.2)

## 2024-04-14 LAB — MAGNESIUM: MAGNESIUM: 2 mg/dL (ref 1.9–2.7)

## 2024-04-20 ENCOUNTER — Ambulatory Visit: Attending: INTERNAL MEDICINE | Admitting: INTERNAL MEDICINE

## 2024-04-20 DIAGNOSIS — R251 Tremor, unspecified: Secondary | ICD-10-CM | POA: Insufficient documentation

## 2024-04-20 LAB — PHOSPHORUS: PHOSPHORUS: 2.6 mg/dL — ABNORMAL LOW (ref 3.7–7.2)

## 2024-04-21 ENCOUNTER — Ambulatory Visit: Attending: INTERNAL MEDICINE | Admitting: INTERNAL MEDICINE

## 2024-04-21 DIAGNOSIS — R054 Cough syncope: Secondary | ICD-10-CM

## 2024-04-21 LAB — COMPREHENSIVE METABOLIC PANEL, NON-FASTING
ALBUMIN/GLOBULIN RATIO: 1.5 — ABNORMAL HIGH (ref 0.8–1.4)
ALBUMIN: 3.6 g/dL (ref 3.5–5.7)
ALKALINE PHOSPHATASE: 104 U/L (ref 34–104)
ALT (SGPT): 5 U/L — ABNORMAL LOW (ref 7–52)
ANION GAP: 6 mmol/L (ref 4–13)
AST (SGOT): 18 U/L (ref 13–39)
BILIRUBIN TOTAL: 0.4 mg/dL (ref 0.3–1.0)
BUN/CREA RATIO: 22 (ref 6–22)
BUN: 18 mg/dL (ref 7–25)
CALCIUM, CORRECTED: 9.2 mg/dL (ref 8.9–10.8)
CALCIUM: 8.9 mg/dL (ref 8.6–10.3)
CHLORIDE: 104 mmol/L (ref 98–107)
CO2 TOTAL: 29 mmol/L (ref 21–31)
CREATININE: 0.81 mg/dL (ref 0.60–1.30)
ESTIMATED GFR: 86 mL/min/1.73mˆ2 (ref >59)
GLOBULIN: 2.4 (ref 2.0–3.5)
GLUCOSE: 120 mg/dL — ABNORMAL HIGH (ref 74–109)
OSMOLALITY, CALCULATED: 281 mosm/kg (ref 270–290)
POTASSIUM: 4 mmol/L (ref 3.5–5.1)
PROTEIN TOTAL: 6 g/dL — ABNORMAL LOW (ref 6.4–8.9)
SODIUM: 139 mmol/L (ref 136–145)

## 2024-04-21 LAB — CBC
HCT: 43.2 % (ref 36.7–47.1)
HGB: 14.8 g/dL (ref 12.5–16.3)
MCH: 30.2 pg (ref 23.8–33.4)
MCHC: 34.3 g/dL (ref 32.5–36.3)
MCV: 88 fL (ref 73.0–96.2)
MPV: 8.1 fL (ref 7.4–11.4)
PLATELETS: 196 x10ˆ3/uL (ref 140–440)
RBC: 4.91 x10ˆ6/uL (ref 4.06–5.63)
RDW: 14.9 % (ref 12.1–16.2)
WBC: 4.4 x10ˆ3/uL (ref 3.6–10.2)

## 2024-04-21 LAB — MAGNESIUM: MAGNESIUM: 1.9 mg/dL (ref 1.9–2.7)

## 2024-05-12 ENCOUNTER — Other Ambulatory Visit: Payer: Self-pay

## 2024-05-12 ENCOUNTER — Ambulatory Visit: Attending: INTERNAL MEDICINE | Admitting: INTERNAL MEDICINE

## 2024-05-12 DIAGNOSIS — Z79899 Other long term (current) drug therapy: Secondary | ICD-10-CM | POA: Insufficient documentation

## 2024-05-12 LAB — CBC
HCT: 45.2 % (ref 36.7–47.1)
HGB: 15.4 g/dL (ref 12.5–16.3)
MCH: 30.1 pg (ref 23.8–33.4)
MCHC: 34 g/dL (ref 32.5–36.3)
MCV: 88.6 fL (ref 73.0–96.2)
MPV: 8.2 fL (ref 7.4–11.4)
PLATELETS: 227 x10ˆ3/uL (ref 140–440)
RBC: 5.1 x10ˆ6/uL (ref 4.06–5.63)
RDW: 14.7 % (ref 12.1–16.2)
WBC: 5.9 x10ˆ3/uL (ref 3.6–10.2)

## 2024-05-12 LAB — COMPREHENSIVE METABOLIC PANEL, NON-FASTING
ALBUMIN/GLOBULIN RATIO: 1.4 (ref 0.8–1.4)
ALBUMIN: 3.7 g/dL (ref 3.5–5.7)
ALKALINE PHOSPHATASE: 117 U/L — ABNORMAL HIGH (ref 34–104)
ALT (SGPT): 13 U/L (ref 7–52)
ANION GAP: 7 mmol/L (ref 4–13)
AST (SGOT): 16 U/L (ref 13–39)
BILIRUBIN TOTAL: 0.4 mg/dL (ref 0.3–1.0)
BUN/CREA RATIO: 29 — ABNORMAL HIGH (ref 6–22)
BUN: 20 mg/dL (ref 7–25)
CALCIUM, CORRECTED: 9.3 mg/dL (ref 8.9–10.8)
CALCIUM: 9.1 mg/dL (ref 8.6–10.3)
CHLORIDE: 105 mmol/L (ref 98–107)
CO2 TOTAL: 31 mmol/L (ref 21–31)
CREATININE: 0.68 mg/dL (ref 0.60–1.30)
ESTIMATED GFR: 91 mL/min/1.73mˆ2 (ref >59)
GLOBULIN: 2.6 (ref 2.0–3.5)
GLUCOSE: 87 mg/dL (ref 74–109)
OSMOLALITY, CALCULATED: 287 mosm/kg (ref 270–290)
POTASSIUM: 4.5 mmol/L (ref 3.5–5.1)
PROTEIN TOTAL: 6.3 g/dL — ABNORMAL LOW (ref 6.4–8.9)
SODIUM: 143 mmol/L (ref 136–145)

## 2024-05-12 LAB — PHOSPHORUS: PHOSPHORUS: 3 mg/dL — ABNORMAL LOW (ref 3.7–7.2)

## 2024-05-12 LAB — VITAMIN D 25 TOTAL: VITAMIN D 25, TOTAL: 44.25 ng/mL (ref 30.00–100.00)

## 2024-05-26 ENCOUNTER — Ambulatory Visit: Attending: INTERNAL MEDICINE | Admitting: INTERNAL MEDICINE

## 2024-05-26 DIAGNOSIS — M62838 Other muscle spasm: Secondary | ICD-10-CM | POA: Insufficient documentation

## 2024-05-26 LAB — COMPREHENSIVE METABOLIC PANEL, NON-FASTING
ALBUMIN/GLOBULIN RATIO: 1.3 (ref 0.8–1.4)
ALBUMIN: 3.9 g/dL (ref 3.5–5.7)
ALKALINE PHOSPHATASE: 119 U/L — ABNORMAL HIGH (ref 34–104)
ALT (SGPT): 9 U/L (ref 7–52)
ANION GAP: 6 mmol/L (ref 4–13)
AST (SGOT): 21 U/L (ref 13–39)
BILIRUBIN TOTAL: 0.4 mg/dL (ref 0.3–1.0)
BUN/CREA RATIO: 26 — ABNORMAL HIGH (ref 6–22)
BUN: 22 mg/dL (ref 7–25)
CALCIUM, CORRECTED: 9.4 mg/dL (ref 8.9–10.8)
CALCIUM: 9.3 mg/dL (ref 8.6–10.3)
CHLORIDE: 104 mmol/L (ref 98–107)
CO2 TOTAL: 31 mmol/L (ref 21–31)
CREATININE: 0.86 mg/dL (ref 0.60–1.30)
ESTIMATED GFR: 84 mL/min/1.73mˆ2 (ref >59)
GLOBULIN: 2.9 (ref 2.0–3.5)
GLUCOSE: 97 mg/dL (ref 74–109)
OSMOLALITY, CALCULATED: 285 mosm/kg (ref 270–290)
POTASSIUM: 4.2 mmol/L (ref 3.5–5.1)
PROTEIN TOTAL: 6.8 g/dL (ref 6.4–8.9)
SODIUM: 141 mmol/L (ref 136–145)

## 2024-05-26 LAB — CBC
HCT: 46.4 % (ref 36.7–47.1)
HGB: 15.7 g/dL (ref 12.5–16.3)
MCH: 30.1 pg (ref 23.8–33.4)
MCHC: 33.7 g/dL (ref 32.5–36.3)
MCV: 89.4 fL (ref 73.0–96.2)
MPV: 8 fL (ref 7.4–11.4)
PLATELETS: 211 x10ˆ3/uL (ref 140–440)
RBC: 5.19 x10ˆ6/uL (ref 4.06–5.63)
RDW: 14.9 % (ref 12.1–16.2)
WBC: 5.2 x10ˆ3/uL (ref 3.6–10.2)

## 2024-05-26 LAB — PHOSPHORUS: PHOSPHORUS: 3.8 mg/dL (ref 3.7–7.2)

## 2024-05-26 LAB — MAGNESIUM: MAGNESIUM: 2 mg/dL (ref 1.9–2.7)

## 2024-06-08 ENCOUNTER — Other Ambulatory Visit: Payer: Self-pay

## 2024-06-08 ENCOUNTER — Emergency Department
Admission: EM | Admit: 2024-06-08 | Discharge: 2024-06-08 | Disposition: A | Discharge status: Skilled Nursing Facility | Attending: Family | Admitting: Family

## 2024-06-08 ENCOUNTER — Emergency Department (HOSPITAL_BASED_OUTPATIENT_CLINIC_OR_DEPARTMENT_OTHER)

## 2024-06-08 ENCOUNTER — Encounter (HOSPITAL_BASED_OUTPATIENT_CLINIC_OR_DEPARTMENT_OTHER): Payer: Self-pay

## 2024-06-08 DIAGNOSIS — R519 Headache, unspecified: Secondary | ICD-10-CM

## 2024-06-08 DIAGNOSIS — G20B1 Parkinson's disease with dyskinesia, without mention of fluctuations: Secondary | ICD-10-CM

## 2024-06-08 DIAGNOSIS — R9431 Abnormal electrocardiogram [ECG] [EKG]: Secondary | ICD-10-CM | POA: Insufficient documentation

## 2024-06-08 DIAGNOSIS — J9811 Atelectasis: Secondary | ICD-10-CM | POA: Insufficient documentation

## 2024-06-08 DIAGNOSIS — G20A1 Parkinson's disease without dyskinesia, without mention of fluctuations: Secondary | ICD-10-CM | POA: Insufficient documentation

## 2024-06-08 DIAGNOSIS — I498 Other specified cardiac arrhythmias: Secondary | ICD-10-CM | POA: Insufficient documentation

## 2024-06-08 DIAGNOSIS — G319 Degenerative disease of nervous system, unspecified: Secondary | ICD-10-CM | POA: Insufficient documentation

## 2024-06-08 DIAGNOSIS — E875 Hyperkalemia: Secondary | ICD-10-CM

## 2024-06-08 DIAGNOSIS — R29700 NIHSS score 0: Secondary | ICD-10-CM

## 2024-06-08 DIAGNOSIS — R251 Tremor, unspecified: Secondary | ICD-10-CM

## 2024-06-08 DIAGNOSIS — Z87891 Personal history of nicotine dependence: Secondary | ICD-10-CM | POA: Insufficient documentation

## 2024-06-08 DIAGNOSIS — I4510 Unspecified right bundle-branch block: Secondary | ICD-10-CM | POA: Insufficient documentation

## 2024-06-08 LAB — COMPREHENSIVE METABOLIC PANEL, NON-FASTING
ALBUMIN/GLOBULIN RATIO: 0.9 (ref 0.8–1.4)
ALBUMIN: 3.3 g/dL — ABNORMAL LOW (ref 3.4–5.0)
ALKALINE PHOSPHATASE: 144 U/L — ABNORMAL HIGH (ref 46–116)
ALT (SGPT): 12 U/L (ref <=78)
ANION GAP: 10 mmol/L (ref 4–13)
AST (SGOT): 29 U/L (ref 15–37)
BILIRUBIN TOTAL: 0.4 mg/dL (ref 0.2–1.0)
BUN/CREA RATIO: 23
BUN: 19 mg/dL — ABNORMAL HIGH (ref 7–18)
CALCIUM, CORRECTED: 10 mg/dL
CALCIUM: 9.4 mg/dL (ref 8.5–10.1)
CHLORIDE: 103 mmol/L (ref 98–107)
CO2 TOTAL: 27 mmol/L (ref 21–32)
CREATININE: 0.81 mg/dL (ref 0.70–1.30)
ESTIMATED GFR: 86 mL/min/1.73mˆ2 (ref >59)
GLOBULIN: 3.5
GLUCOSE: 110 mg/dL — ABNORMAL HIGH (ref 74–106)
OSMOLALITY, CALCULATED: 282 mosm/kg (ref 270–290)
POTASSIUM: 5.2 mmol/L — ABNORMAL HIGH (ref 3.5–5.1)
PROTEIN TOTAL: 6.8 g/dL (ref 6.4–8.2)
SODIUM: 140 mmol/L (ref 136–145)

## 2024-06-08 LAB — CBC WITH DIFF
BASOPHIL #: 0.01 x10ˆ3/uL (ref 0.00–0.10)
BASOPHIL %: 0 % (ref 0–1)
EOSINOPHIL #: 0.1 x10ˆ3/uL (ref 0.00–0.60)
EOSINOPHIL %: 2 % (ref 1–8)
HCT: 47.7 % — ABNORMAL HIGH (ref 36.7–47.1)
HGB: 15.7 g/dL (ref 12.5–16.3)
LYMPHOCYTE #: 0.76 x10ˆ3/uL — ABNORMAL LOW (ref 1.00–3.00)
LYMPHOCYTE %: 13 % — ABNORMAL LOW (ref 15–43)
MCH: 30.4 pg (ref 23.8–33.4)
MCHC: 32.9 g/dL (ref 32.5–36.3)
MCV: 92.5 fL (ref 73.0–96.2)
MONOCYTE #: 0.34 x10ˆ3/uL (ref 0.30–1.00)
MONOCYTE %: 6 % (ref 6–14)
MPV: 8.5 fL (ref 7.4–11.4)
NEUTROPHIL #: 4.73 x10ˆ3/uL (ref 1.85–7.84)
NEUTROPHIL %: 80 % — ABNORMAL HIGH (ref 44–74)
PLATELETS: 181 x10ˆ3/uL (ref 140–440)
RBC: 5.16 x10ˆ6/uL (ref 4.06–5.63)
RDW: 17.2 % — ABNORMAL HIGH (ref 12.1–16.2)
WBC: 5.9 x10ˆ3/uL (ref 3.6–10.2)

## 2024-06-08 LAB — TROPONIN-I: TROPONIN I: 6 ng/L (ref <20)

## 2024-06-08 NOTE — ED Nurses Note (Addendum)
 Pt lying in bed. Pt denies need to urinate and voiced no concerns at this time. VSS

## 2024-06-08 NOTE — ED Nurses Note (Signed)
 [facility] here for transport to [facility]

## 2024-06-08 NOTE — ED Provider Notes (Signed)
 [facility] - Emergency Department  ED Physician/ NP Note      Arrival: Ambulance    Chief Complaint:    Chief Complaint   Patient presents with    Tremors     Ems reports pt from [facility] with reports of hx Parkinson's disease but tremors have increased and it now worse and pt has difficulty standing and moving and he has trouble standing and walking now.  Nursing home requests pt have ct scan. Blood sugar in route 108     Steven Lam is a 88 y.o. male who had concerns including Tremors.      History of Present Illness:  Steven Lam is a 88 y.o. male who presents to the ED today for: had concerns including Tremors.  History of Present Illness  Steven Lam is an 88 year old male who presents with jerking movements.    He experiences involuntary jerking movements, though the duration and specific characteristics are not detailed. The patient denies headache or dizziness. He does have a hx of Parkinson's disease.    He mentions having broken titanium rods in his body, which are described as 'all over' and 'all broken up'. There is no further elaboration on the history or impact of these rods.      Review of Systems:  10 systems reviewed and all systems negative except as stated in history of present illness.    PMH/PSH/FH/SH:    Past Medical History:   Diagnosis Date    Asymptomatic PVCs     B12 deficiency     Bipolar disorder, unspecified     BPH associated with nocturia     Cancer (CMS HCC)     Degenerative cervical spinal stenosis     Eczema of both external ears     Essential hypertension     Gross hematuria     Hearing loss     History of COVID-19     History of rectal fissure     Hydronephrosis     Impacted cerumen of both ears     Left rotator cuff tear     Lower extremity weakness     Mixed hyperlipidemia     Orthostatic hypotension     Parkinson's disease     Sleep apnea     Spinal curvature     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)     Upper extremity weakness      Vitamin D deficiency        Past Surgical History:   Procedure Laterality Date    Hx back surgery      Hx cataract removal      Hx cholecystectomy      Hx hip replacement Left     Hx tonsillectomy      Laparoscopic cholecystectomy         Family History   Problem Relation Age of Onset    Colon Cancer Mother     Dementia Mother     Heart Attack Father     Diabetes type II Brother     Heart Attack Brother        Social History     Socioeconomic History    Marital status: Divorced     Spouse name: Not on file    Number of children: Not on file    Years of education: Not on file    Highest education level: Not on file   Occupational History    Not on file   Tobacco  Use    Smoking status: Former     Current packs/day: 0.00     Average packs/day: 1 pack/day for 10.0 years (10.0 ttl pk-yrs)     Types: Cigarettes     Start date: 08/04/1958     Quit date: 08/04/1968     Years since quitting: 55.8    Smokeless tobacco: Never   Vaping Use    Vaping status: Never Used   Substance and Sexual Activity    Alcohol use: Never    Drug use: Never    Sexual activity: Not on file   Other Topics Concern    Not on file   Social History Narrative    Not on file     Social Determinants of Health     Financial Resource Strain: Not on file   Transportation Needs: Not on file   Social Connections: Not on file   Intimate Partner Violence: Not on file   Housing Stability: Not on file       Meds/Allergies:    Current Outpatient Medications   Medication Instructions    acetaminophen (TYLENOL) 650 mg, EVERY 4 HOURS PRN    ascorbic acid (vitamin C) (VITAMIN C) 500 mg, Daily    baclofen (LIORESAL) 5 mg, 3 TIMES DAILY    carbidopa -Levodopa  (SINEMET ) 25-100 mg Oral Tablet 1 Tablet, 4 TIMES DAILY    cholecalciferol  (vitamin D3) 1,000 Units, Daily    Fluocinolone  Acetonide Oil (DERMOTIC  OIL) 0.01 % Otic Drops 4 gtts each ear bid x 7 days    guaifenesin/dextromethorphan (ROBAFEN DM COUGH ORAL) Take by mouth    HYDROcodone-acetaminophen (NORCO) 5-325 mg  Oral Tablet 1 Tablet, EVERY 8 HOURS PRN    latanoprost  (XALATAN ) 0.005 % Ophthalmic Drops 1 Drop, Both Eyes, NIGHTLY    lisinopriL  (PRINIVIL ) 10 mg Oral Tablet TAKE 1 TABLET BY MOUTH EVERY DAY FOR BLOOD PRESSURE    multivitamin with minerals Oral Tablet 1 Tablet    PARoxetine  (PAXIL ) 20 mg, Oral, Daily    potassium, sodium phosphates (PHOS-NA-K) 280-160-250 mg Oral Powder in Packet 1 Packet, 3 TIMES DAILY WITH MEALS    predniSONE (DELTASONE) 5 mg, Daily    tamsulosin  (FLOMAX ) 0.4 mg, Oral, Daily    triamcinolone  acetonide 0.1 % Cream Topical, 2 TIMES DAILY      Allergies[1]      Physical Exam:    ED Triage Vitals [06/08/24 1809]   BP (Non-Invasive) 106/72   Heart Rate 77   Respiratory Rate 18   Temperature (!) 35.7 C (96.2 F)   SpO2 91 %   Weight 77.1 kg (170 lb)   Height 1.702 m (5' 7)       CONSTITUTIONAL: [Alert and oriented and responds appropriately to questions. well-nourished]  HEAD: [Normocephalic; atraumatic]  EYES: [Conjunctiva non-injected]  ENT: [moist mucous membranes]   NECK: [Supple]  CARD: [RRR]  RESP: [no respiratory distress]  ABD/GI: [non-distended]  BACK: [normal ROM]  EXT: [appear atraumatic]   SKIN: [Normal color for age and race; warm; dry; good turgor; no acute lesions noted]  NEURO: [Moves all extremities equally]  PSYCH: [The patient's mood and manner are appropriate. Grooming and personal hygiene are appropriate]      Procedure:  Procedures    Results:  Labs:   Labs Ordered/Reviewed   COMPREHENSIVE METABOLIC PANEL, NON-FASTING - Abnormal; Notable for the following components:       Result Value    POTASSIUM 5.2 (*)     BUN 19 (*)     ALBUMIN 3.3 (*)  GLUCOSE 110 (*)     ALKALINE PHOSPHATASE 144 (*)     All other components within normal limits    Narrative:     Estimated Glomerular Filtration Rate (eGFR) is calculated using the CKD-EPI (2021) equation, intended for patients 88 years of age and older. If gender is not documented or unknown, there will be no eGFR calculation.   CBC  WITH DIFF - Abnormal; Notable for the following components:    HCT 47.7 (*)     RDW 17.2 (*)     NEUTROPHIL % 80 (*)     LYMPHOCYTE % 13 (*)     LYMPHOCYTE # 0.76 (*)     All other components within normal limits   TROPONIN-I - Normal    Narrative:     Values received on females ranging between 12-15 ng/L MUST include the next serial troponin to review changes in the delta differences as the reference range for the Access II chemistry analyzer is lower than the established reference range.     CBC/DIFF    Narrative:     The following orders were created for panel order CBC/DIFF.  Procedure                               Abnormality         Status                     ---------                               -----------         ------                     CBC WITH IPQQ[[id number]]                Abnormal            Final result                 Please view results for these tests on the individual orders.        Imaging:  CT BRAIN WO IV CONTRAST   Final Result by Edi, Radresults In (11/05 1855)   CHRONIC CHANGES.  NO ACUTE FINDINGS.          Radiologist location ID: [id number]         XR CHEST PA AND LATERAL   Final Result by Edi, Radresults In (11/05 1852)   Subsegmental atelectasis in the lung bases.      Fractures of the stabilization rods.               Radiologist location ID: [id number]                ED Course:                      MDM:       Medical Decision Making  Amount and/or Complexity of Data Reviewed  Labs: ordered.  Radiology: ordered.  ECG/medicine tests: ordered.        Medical Decision Making  An 88 year old male presented with abnormal involuntary jerking movements. He denied headache and dizziness. He has a history of broken titanium rods throughout his body and a history of Parkinson's disease. Cognitive and motor function appeared intact on exam.  NIH of 0.  Head CT showed only chronic changes, and x-ray revealed no acute orthopedic issues; blood work demonstrated mild hyperkalemia without prior  history.  Potassium of 5.2.  No EKG changes.  Renal function appears normal.  No other acute abnormalities at this time.  At this point no acute emergent findings found, the patient will be discharged back to the nursing facility for continued management of his Parkinson's and tremors by the facility staff.    Differential diagnosis includes, but is not limited to:  - Abnormal involuntary movements (jerking movements): Jerking movements were evaluated with head CT, which showed no acute findings, and there was no history of Parkinson's disease or associated symptoms such as headache or dizziness.  - Mechanical complication of internal orthopedic device: The patient has broken titanium rods throughout his body, but x-ray showed no acute issues and there was no evidence of acute distress.    Abnormal involuntary movements (jerking movements) and mechanical complication of internal orthopedic device  - Discharged home.          Impression:    Clinical Impression   Occasional tremors (Primary)   Parkinson disease         New Prescriptions    No medications on file      Diagnoses       Diagnosis Comment Added By Time Added    Occasional tremors  Claudene Mast, APRN, CNP 06/08/2024  7:57 PM    Parkinson disease  Claudene Mast, APRN, CNP 06/08/2024  7:57 PM               Disposition:   Discharged    Lorita Forinash, APRN, CNP  06/08/2024 20:00      Part of this note may have been generated using voice recognition software.  Be advised, it is possible that the generated note may be prone to syntax and other dictation software errors.           [1]   Allergies  Allergen Reactions    Aspirin      Other reaction(s): Other (See Comments)  Per patient stomach bleed

## 2024-06-08 NOTE — ED Nurses Note (Signed)
 Report called to [facility] at [facility]

## 2024-06-08 NOTE — ED Nurses Note (Signed)
 Awaiting [facility] to transport

## 2024-06-08 NOTE — ED Nurses Note (Signed)
 Pt awake alert to person . States, I have a hard time hearing.  Speech clear. Moves all extremities at will and to command. Generalized weakness noted. Frequent jerking movements note to bilateral arms and upper body. Hx Parkinson's. Cardiac monitor shows SR without ectopy. Respirations even and unlabored. 02 sat 94% room air.

## 2024-06-09 ENCOUNTER — Ambulatory Visit: Attending: INTERNAL MEDICINE | Admitting: INTERNAL MEDICINE

## 2024-06-09 DIAGNOSIS — Z79899 Other long term (current) drug therapy: Secondary | ICD-10-CM | POA: Insufficient documentation

## 2024-06-09 LAB — COMPREHENSIVE METABOLIC PANEL, NON-FASTING
ALBUMIN/GLOBULIN RATIO: 1.3 (ref 0.8–1.4)
ALBUMIN: 3.5 g/dL (ref 3.5–5.7)
ALKALINE PHOSPHATASE: 105 U/L — ABNORMAL HIGH (ref 34–104)
ALT (SGPT): 5 U/L — ABNORMAL LOW (ref 7–52)
ANION GAP: 6 mmol/L (ref 4–13)
AST (SGOT): 19 U/L (ref 13–39)
BILIRUBIN TOTAL: 0.4 mg/dL (ref 0.3–1.0)
BUN/CREA RATIO: 28 — ABNORMAL HIGH (ref 6–22)
BUN: 17 mg/dL (ref 7–25)
CALCIUM, CORRECTED: 9.5 mg/dL (ref 8.9–10.8)
CALCIUM: 9.1 mg/dL (ref 8.6–10.3)
CHLORIDE: 106 mmol/L (ref 98–107)
CO2 TOTAL: 28 mmol/L (ref 21–31)
CREATININE: 0.6 mg/dL (ref 0.60–1.30)
ESTIMATED GFR: 94 mL/min/1.73mˆ2 (ref >59)
GLOBULIN: 2.6 (ref 2.0–3.5)
GLUCOSE: 81 mg/dL (ref 74–109)
OSMOLALITY, CALCULATED: 280 mosm/kg (ref 270–290)
POTASSIUM: 4.3 mmol/L (ref 3.5–5.1)
PROTEIN TOTAL: 6.1 g/dL — ABNORMAL LOW (ref 6.4–8.9)
SODIUM: 140 mmol/L (ref 136–145)

## 2024-06-09 LAB — VITAMIN D 25 TOTAL: VITAMIN D 25, TOTAL: 35.33 ng/mL (ref 30.00–100.00)

## 2024-06-09 LAB — CBC
HCT: 43.7 % (ref 36.7–47.1)
HGB: 14.8 g/dL (ref 12.5–16.3)
MCH: 30.3 pg (ref 23.8–33.4)
MCHC: 33.9 g/dL (ref 32.5–36.3)
MCV: 89.2 fL (ref 73.0–96.2)
MPV: 8.2 fL (ref 7.4–11.4)
PLATELETS: 183 x10ˆ3/uL (ref 140–440)
RBC: 4.9 x10ˆ6/uL (ref 4.06–5.63)
RDW: 15.8 % (ref 12.1–16.2)
WBC: 4.8 x10ˆ3/uL (ref 3.6–10.2)

## 2024-06-10 DIAGNOSIS — I498 Other specified cardiac arrhythmias: Secondary | ICD-10-CM

## 2024-06-10 DIAGNOSIS — I4510 Unspecified right bundle-branch block: Secondary | ICD-10-CM

## 2024-06-10 LAB — ECG 12 LEAD
Atrial Rate: 81 {beats}/min
Calculated P Axis: 36 degrees
Calculated R Axis: -40 degrees
Calculated T Axis: 8 degrees
PR Interval: 192 ms
QRS Duration: 98 ms
QT Interval: 386 ms
QTC Calculation: 448 ms
Ventricular rate: 81 {beats}/min

## 2024-06-13 ENCOUNTER — Other Ambulatory Visit: Payer: Self-pay

## 2024-06-13 ENCOUNTER — Ambulatory Visit: Attending: INTERNAL MEDICINE | Admitting: INTERNAL MEDICINE

## 2024-06-13 DIAGNOSIS — E876 Hypokalemia: Secondary | ICD-10-CM | POA: Insufficient documentation

## 2024-06-13 LAB — BASIC METABOLIC PANEL
ANION GAP: 6 mmol/L (ref 4–13)
BUN/CREA RATIO: 26 — ABNORMAL HIGH (ref 6–22)
BUN: 20 mg/dL (ref 7–25)
CALCIUM: 9.4 mg/dL (ref 8.6–10.3)
CHLORIDE: 103 mmol/L (ref 98–107)
CO2 TOTAL: 30 mmol/L (ref 21–31)
CREATININE: 0.76 mg/dL (ref 0.60–1.30)
ESTIMATED GFR: 88 mL/min/1.73mˆ2 (ref >59)
GLUCOSE: 82 mg/dL (ref 74–109)
OSMOLALITY, CALCULATED: 279 mosm/kg (ref 270–290)
POTASSIUM: 4.3 mmol/L (ref 3.5–5.1)
SODIUM: 139 mmol/L (ref 136–145)

## 2024-06-13 LAB — PHOSPHORUS: PHOSPHORUS: 2.6 mg/dL — ABNORMAL LOW (ref 3.7–7.2)

## 2024-06-20 ENCOUNTER — Ambulatory Visit: Attending: INTERNAL MEDICINE | Admitting: INTERNAL MEDICINE

## 2024-06-20 ENCOUNTER — Other Ambulatory Visit: Payer: Self-pay

## 2024-06-20 DIAGNOSIS — Z79899 Other long term (current) drug therapy: Secondary | ICD-10-CM | POA: Insufficient documentation

## 2024-06-20 LAB — PHOSPHORUS: PHOSPHORUS: 2.4 mg/dL — ABNORMAL LOW (ref 3.7–7.2)

## 2024-06-26 ENCOUNTER — Emergency Department: Admission: EM | Admit: 2024-06-26 | Discharge: 2024-06-27 | Disposition: A

## 2024-06-26 ENCOUNTER — Other Ambulatory Visit: Payer: Self-pay

## 2024-06-26 DIAGNOSIS — N401 Enlarged prostate with lower urinary tract symptoms: Secondary | ICD-10-CM

## 2024-06-26 DIAGNOSIS — R338 Other retention of urine: Secondary | ICD-10-CM

## 2024-06-26 DIAGNOSIS — R339 Retention of urine, unspecified: Secondary | ICD-10-CM

## 2024-06-26 DIAGNOSIS — G20A1 Parkinson's disease without dyskinesia, without mention of fluctuations: Secondary | ICD-10-CM

## 2024-06-26 NOTE — ED Triage Notes (Signed)
 Pt to ED from [facility] after not voiding for 16 hours. PHCC attempted straight cath without success, however patient is now bleeding from urethra.     PER EMS, patient was urinating on way to ED.

## 2024-06-26 NOTE — ED Provider Notes (Signed)
 [place] [facility]  ED Primary Provider Note  Patient Name: Steven Lam  Patient Age: 88 y.o.  Date of Birth: 09-23-1935    Chief Complaint: Urinary Retention              Historical Data   History Reviewed This Encounter: ***History has not been Marked as Reviewed - Go to the top of the ED Triage Tab to document your review. Make sure history sections are updated and accurate - Click Here to jump to History for update.***    ED Triage Vitals [06/26/24 2251]   BP (Non-Invasive) 131/84   Heart Rate (!) 104   Respiratory Rate 19   Temperature 36.3 C (97.3 F)   SpO2 94 %   Weight 77.1 kg (170 lb)   Height 1.702 m (5' 7)     {Be sure to review and modify Physical Exam as appropriate. As of Aug 04, 2021 you are only required to have a medically appropriate ROS and PE for billing purposes. This help text will disappear when signing your note.:123}      History of Present Illness   had concerns including Urinary Retention.     Steven Lam is a 88 y.o. male                 Procedures      Patient Data   {Click here to open the ED Workup Activity for clinical data review *This link will automatically disappear upon signing your note*:123}Labs Ordered/Reviewed - No data to display    No orders to display         ED clinical impression missing, please click on the following link to add Clinical Impressions ***  then refresh the note prior to signing.        Decision for and Complexity of Risk in the ED encounter:***  I ordered prescription medications requiring authorization in the ED or at discharge.  I decided when to consult for hospitalization or escalation of hospital-level care.  I ordered Parenteral Controlled Substances  Social Determinants Contributing to Healthcare Difficulty:   Independent/Additional Historian: *** with information given about   Discussion of care with a provider outside of the Emergency Department:    ***Consent for AI Scribe software [employer]  discussed/obtained verbally for encounter      A 30ml/kg fluid bolus not ordered due to ***.  Please see the EMR for the total amount ordered/administered.                                  {Disposition:38159}        Risk as noted in the medical decision-making is in reference to potential morbidity/mortality of management based upon previously established billing guidelines.    Based upon the clinical setting, the likely diagnosis/impression include:    ED clinical impression missing, please click on the following link to add Clinical Impressions ***  then refresh the note prior to signing.      Current Discharge Medication List                This note was partially created using voice recognition software and is inherently subject to errors including those of syntax and sound alike  substitutions which may escape proof reading. In such instances, original meaning may be extrapolated by contextual derivation.                    {  Remember to refresh your note prior to signing. Use Control + F11 or click the refresh button at the bottom of the note. This reminder text will automatically disappear when you sign your note.:123}

## 2024-06-27 LAB — BASIC METABOLIC PANEL
ANION GAP: 4 mmol/L (ref 4–13)
BUN/CREA RATIO: 23 — ABNORMAL HIGH (ref 6–22)
BUN: 16 mg/dL (ref 7–25)
CALCIUM: 9.1 mg/dL (ref 8.6–10.3)
CHLORIDE: 100 mmol/L (ref 98–107)
CO2 TOTAL: 32 mmol/L — ABNORMAL HIGH (ref 21–31)
CREATININE: 0.71 mg/dL (ref 0.60–1.30)
ESTIMATED GFR: 89 mL/min/1.73mˆ2 (ref >59)
GLUCOSE: 114 mg/dL — ABNORMAL HIGH (ref 74–109)
OSMOLALITY, CALCULATED: 274 mosm/kg (ref 270–290)
POTASSIUM: 4.3 mmol/L (ref 3.5–5.1)
SODIUM: 136 mmol/L (ref 136–145)

## 2024-06-27 LAB — CBC WITH DIFF
BASOPHIL #: 0 x10ˆ3/uL (ref 0.00–0.10)
BASOPHIL %: 0 % (ref 0–1)
EOSINOPHIL #: 0.1 x10ˆ3/uL (ref 0.00–0.60)
EOSINOPHIL %: 1 % (ref 1–8)
HCT: 47 % (ref 36.7–47.1)
HGB: 15.7 g/dL (ref 12.5–16.3)
LYMPHOCYTE #: 1 x10ˆ3/uL (ref 1.00–3.00)
LYMPHOCYTE %: 12 % — ABNORMAL LOW (ref 15–43)
MCH: 30.1 pg (ref 23.8–33.4)
MCHC: 33.4 g/dL (ref 32.5–36.3)
MCV: 90.2 fL (ref 73.0–96.2)
MONOCYTE #: 0.6 x10ˆ3/uL (ref 0.30–1.10)
MONOCYTE %: 7 % (ref 6–14)
MPV: 7.6 fL (ref 7.4–11.4)
NEUTROPHIL #: 6.5 x10ˆ3/uL (ref 1.85–7.84)
NEUTROPHIL %: 80 % — ABNORMAL HIGH (ref 44–74)
PLATELETS: 217 x10ˆ3/uL (ref 140–440)
RBC: 5.21 x10ˆ6/uL (ref 4.06–5.63)
RDW: 15 % (ref 12.1–16.2)
WBC: 8.2 x10ˆ3/uL (ref 3.6–10.2)

## 2024-06-27 LAB — URINALYSIS, MACROSCOPIC
BILIRUBIN: NEGATIVE mg/dL
BLOOD: 0.03 mg/dL
GLUCOSE: NEGATIVE mg/dL
KETONES: NEGATIVE mg/dL
LEUKOCYTES: NEGATIVE WBCs/uL
NITRITE: NEGATIVE
PH: 6.5 (ref 5.0–9.0)
PROTEIN: NEGATIVE mg/dL
SPECIFIC GRAVITY: 1.014 (ref 1.002–1.030)
UROBILINOGEN: NORMAL mg/dL

## 2024-06-27 LAB — URINALYSIS, MICROSCOPIC
RBCS: 11 /HPF — ABNORMAL HIGH (ref <4)
WBCS: 4 /HPF (ref <6)

## 2024-06-27 LAB — SCAN DIFFERENTIAL
PLATELET MORPHOLOGY COMMENT: NORMAL
RBC MORPHOLOGY COMMENT: NORMAL
SCHISTOCYTES: ABSENT

## 2024-06-27 NOTE — ED Nurses Note (Signed)
 PRS at bedside to transport pt to [place] [facility]. Patient in NAD, vitals stable. Patient belongings on stretcher with patient. Discharge papers handed to PRS to give to [facility] staff. Care handed off to PRS at this time.

## 2024-06-27 NOTE — ED Nurses Note (Signed)
 Report called to [facility] to Sheldon, RN at this time.

## 2024-06-27 NOTE — ED Nurses Note (Signed)
 This tech assisted Mckenzie, RN in changing pt brief, pulled pt up in bed, and call light in reach.

## 2024-06-27 NOTE — Discharge Instructions (Addendum)
 See handouts for details, follow up with Urology, for new or worsening symptoms please seek medical attention.

## 2024-07-07 ENCOUNTER — Ambulatory Visit: Attending: INTERNAL MEDICINE | Admitting: INTERNAL MEDICINE

## 2024-07-07 DIAGNOSIS — E875 Hyperkalemia: Secondary | ICD-10-CM | POA: Insufficient documentation

## 2024-07-07 LAB — BASIC METABOLIC PANEL
ANION GAP: 6 mmol/L (ref 4–13)
BUN/CREA RATIO: 20 (ref 6–22)
BUN: 11 mg/dL (ref 7–25)
CALCIUM: 8.8 mg/dL (ref 8.6–10.3)
CHLORIDE: 102 mmol/L (ref 98–107)
CO2 TOTAL: 31 mmol/L (ref 21–31)
CREATININE: 0.56 mg/dL — ABNORMAL LOW (ref 0.60–1.30)
ESTIMATED GFR: 96 mL/min/1.73mˆ2 (ref >59)
GLUCOSE: 108 mg/dL (ref 74–109)
OSMOLALITY, CALCULATED: 278 mosm/kg (ref 270–290)
POTASSIUM: 4.3 mmol/L (ref 3.5–5.1)
SODIUM: 139 mmol/L (ref 136–145)

## 2024-08-21 ENCOUNTER — Ambulatory Visit: Attending: INTERNAL MEDICINE | Admitting: INTERNAL MEDICINE

## 2024-08-21 DIAGNOSIS — M6281 Muscle weakness (generalized): Secondary | ICD-10-CM | POA: Insufficient documentation

## 2024-08-21 LAB — URINALYSIS, MICROSCOPIC
RBCS: >100 /HPF — AB (ref 1–5)
WBCS: >100 /HPF — AB (ref 1–5)

## 2024-08-21 LAB — URINALYSIS, MACROSCOPIC
BILIRUBIN: NEGATIVE mg/dL
GLUCOSE: NEGATIVE mg/dL
KETONES: NEGATIVE mg/dL
NITRITE: NEGATIVE
PH: 8 (ref 5.0–8.0)
PROTEIN: 50 mg/dL — AB
SPECIFIC GRAVITY: 1.019 (ref 1.005–1.030)
UROBILINOGEN: NEGATIVE mg/dL

## 2024-08-23 LAB — URINE CULTURE,ROUTINE: URINE CULTURE: >100000 — AB

## 2024-09-02 ENCOUNTER — Ambulatory Visit (INDEPENDENT_AMBULATORY_CARE_PROVIDER_SITE_OTHER): Payer: Self-pay | Admitting: Physician Assistant

## 2024-09-08 ENCOUNTER — Ambulatory Visit: Admitting: INTERNAL MEDICINE

## 2024-09-08 DIAGNOSIS — Z79899 Other long term (current) drug therapy: Secondary | ICD-10-CM

## 2024-09-08 LAB — COMPREHENSIVE METABOLIC PANEL, NON-FASTING
ALBUMIN/GLOBULIN RATIO: 1.2 (ref 0.8–1.4)
ALBUMIN: 3.4 g/dL — ABNORMAL LOW (ref 3.5–5.7)
ALKALINE PHOSPHATASE: 100 U/L (ref 34–104)
ALT (SGPT): 9 U/L (ref 7–52)
ANION GAP: 4 mmol/L (ref 4–13)
AST (SGOT): 18 U/L (ref 13–39)
BILIRUBIN TOTAL: 0.3 mg/dL (ref 0.3–1.0)
BUN/CREA RATIO: 15 (ref 6–22)
BUN: 9 mg/dL (ref 7–25)
CALCIUM, CORRECTED: 9.2 mg/dL (ref 8.9–10.8)
CALCIUM: 8.7 mg/dL (ref 8.6–10.3)
CHLORIDE: 103 mmol/L (ref 98–107)
CO2 TOTAL: 34 mmol/L — ABNORMAL HIGH (ref 21–31)
CREATININE: 0.6 mg/dL (ref 0.60–1.30)
ESTIMATED GFR: 93 mL/min/{1.73_m2} (ref >59)
GLOBULIN: 2.8 (ref 2.0–3.5)
GLUCOSE: 185 mg/dL — ABNORMAL HIGH (ref 74–109)
OSMOLALITY, CALCULATED: 285 mosm/kg (ref 270–290)
POTASSIUM: 4.4 mmol/L (ref 3.5–5.1)
PROTEIN TOTAL: 6.2 g/dL — ABNORMAL LOW (ref 6.4–8.9)
SODIUM: 141 mmol/L (ref 136–145)
